# Patient Record
Sex: Female | Born: 1975 | Race: Black or African American | Hispanic: No | Marital: Single | Smoking: Never smoker
Health system: Southern US, Community
[De-identification: ages and names within clinical notes are randomized; demographics above are authoritative.]

## PROBLEM LIST (undated history)

## (undated) DIAGNOSIS — E559 Vitamin D deficiency, unspecified: Secondary | ICD-10-CM

## (undated) DIAGNOSIS — R87619 Unspecified abnormal cytological findings in specimens from cervix uteri: Secondary | ICD-10-CM

## (undated) HISTORY — DX: Unspecified abnormal cytological findings in specimens from cervix uteri: R87.619

## (undated) HISTORY — DX: Vitamin D deficiency, unspecified: E55.9

---

## 1999-09-23 ENCOUNTER — Other Ambulatory Visit: Admission: RE | Admit: 1999-09-23 | Discharge: 1999-09-23 | Payer: Self-pay | Admitting: Obstetrics and Gynecology

## 2000-05-09 ENCOUNTER — Other Ambulatory Visit: Admission: RE | Admit: 2000-05-09 | Discharge: 2000-05-09 | Payer: Self-pay | Admitting: Obstetrics and Gynecology

## 2001-04-19 ENCOUNTER — Other Ambulatory Visit: Admission: RE | Admit: 2001-04-19 | Discharge: 2001-04-19 | Payer: Self-pay | Admitting: *Deleted

## 2002-09-14 ENCOUNTER — Other Ambulatory Visit: Admission: RE | Admit: 2002-09-14 | Discharge: 2002-09-14 | Payer: Self-pay | Admitting: Obstetrics and Gynecology

## 2006-10-20 ENCOUNTER — Other Ambulatory Visit: Admission: RE | Admit: 2006-10-20 | Discharge: 2006-10-20 | Payer: Self-pay | Admitting: Obstetrics & Gynecology

## 2014-01-25 ENCOUNTER — Encounter: Payer: Self-pay | Admitting: Certified Nurse Midwife

## 2014-01-28 ENCOUNTER — Encounter: Payer: Self-pay | Admitting: Certified Nurse Midwife

## 2014-01-28 ENCOUNTER — Ambulatory Visit (INDEPENDENT_AMBULATORY_CARE_PROVIDER_SITE_OTHER): Payer: BC Managed Care – PPO | Admitting: Certified Nurse Midwife

## 2014-01-28 VITALS — BP 112/77 | HR 84 | Resp 16 | Ht 60.25 in | Wt 152.0 lb

## 2014-01-28 DIAGNOSIS — Z01419 Encounter for gynecological examination (general) (routine) without abnormal findings: Secondary | ICD-10-CM

## 2014-01-28 DIAGNOSIS — Z309 Encounter for contraceptive management, unspecified: Secondary | ICD-10-CM

## 2014-01-28 MED ORDER — LEVONORGEST-ETH ESTRAD 91-DAY 0.15-0.03 MG PO TABS: 1.0000 | ORAL_TABLET | Freq: Every day | ORAL | Status: DC

## 2014-01-28 NOTE — Progress Notes (Signed)
38 y.o. G0P0000 Single African American Fe here for annual exam. Periods normal, but heavier days 2 an 4, but no issues with. No STD screening desired. No partner change. No health issues today.   Patient's last menstrual period was 10/31/2013.          Sexually active: yes  The current method of family planning is OCP (estrogen/progesterone).    Exercising: no  exercise Smoker:  no  Health Maintenance: Pap:  01-26-13 neg hpv hr neg MMG:  none Colonoscopy:  none BMD:   none TDaP:  2/13 Labs: none Self breast exam: done occ   reports that she has never smoked. She does not have any smokeless tobacco history on file. She reports that she drinks about 1.0 ounces of alcohol per week. She reports that she does not use illicit drugs.  Past Medical History  Diagnosis Date  . Abnormal Pap smear of cervix     ASCUS 2000,2007    Past Surgical History  Procedure Laterality Date  . 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 levonorgestrel-ethinyl estradiol (SEASONALE) 0.15-0.03 MG tablet Take 1 tablet by mouth daily.      . Multiple Vitamins-Minerals (MULTIVITAMIN PO) Take by mouth as needed.        No current facility-administered medications for this visit.    Family History  Problem Relation Age of Onset  . Cancer Paternal Grandfather     ROS:  Pertinent items are noted in HPI.  Otherwise, a comprehensive ROS was negative.  Exam:   BP 112/77  Pulse 84  Resp 16  Ht 5' 0.25" (1.53 m)  Wt 152 lb (68.947 kg)  BMI 29.45 kg/m2  LMP 10/31/2013 Height: 5' 0.25" (153 cm)  Ht Readings from Last 3 Encounters:  01/28/14 5' 0.25" (1.53 m)    General appearance: alert, cooperative and appears stated age Head: Normocephalic, without obvious abnormality, atraumatic Neck: no adenopathy, supple, symmetrical, trachea midline and thyroid normal to inspection and palpation Lungs: clear to auscultation bilaterally Breasts: normal  appearance, no masses or tenderness, No nipple retraction or dimpling, No nipple discharge or bleeding, No axillary or supraclavicular adenopathy Heart: regular rate and rhythm Abdomen: soft, non-tender; no masses,  no organomegaly Extremities: extremities normal, atraumatic, no cyanosis or edema Skin: Skin color, texture, turgor normal. No rashes or lesions Lymph nodes: Cervical, supraclavicular, and axillary nodes normal. No abnormal inguinal nodes palpated Neurologic: Grossly normal   Pelvic: External genitalia:  no lesions              Urethra:  normal appearing urethra with no masses, tenderness or lesions              Bartholin's and Skene's: normal                 Vagina: normal appearing vagina with normal color and discharge, no lesions              Cervix: normal,  non tender              Pap taken: no Bimanual Exam:  Uterus:  normal size, contour, position, consistency, mobility, non-tender and mid position              Adnexa: normal adnexa and no mass, fullness, tenderness               Rectovaginal: Confirms  Anus:  normal sphincter tone, no lesions  A:  Well Woman with normal exam  Contraception OCP desired  P:   Reviewed health and wellness pertinent to exam  Rx Seasonale see order  Pap smear as per guidelines   Mammogram Start at age 38 given information to schedule pap smear not taken today  counseled on breast self exam, mammography screening, STD prevention, HIV risk factors and prevention, use and side effects of OCP's, adequate intake of calcium and vitamin D, diet and exercise  return annually or prn  An After Visit Summary was printed and given to the patient.

## 2014-01-28 NOTE — Patient Instructions (Signed)
General topics  Next pap or exam is  due in 1 year Take a Women's multivitamin Take 1200 mg. of calcium daily - prefer dietary If any concerns in interim to call back  Breast Self-Awareness Practicing breast self-awareness may pick up problems early, prevent significant medical complications, and possibly save your life. By practicing breast self-awareness, you can become familiar with how your breasts look and feel and if your breasts are changing. This allows you to notice changes early. It can also offer you some reassurance that your breast health is good. One way to learn what is normal for your breasts and whether your breasts are changing is to do a breast self-exam. If you find a lump or something that was not present in the past, it is best to contact your caregiver right away. Other findings that should be evaluated by your caregiver include nipple discharge, especially if it is bloody; skin changes or reddening; areas where the skin seems to be pulled in (retracted); or new lumps and bumps. Breast pain is seldom associated with cancer (malignancy), but should also be evaluated by a caregiver. BREAST SELF-EXAM The best time to examine your breasts is 5 7 days after your menstrual period is over.  ExitCare Patient Information 2013 [employer].   Exercise to Stay Healthy Exercise helps you become and stay healthy. EXERCISE IDEAS AND TIPS Choose exercises that:  You enjoy.  Fit into your day. You do not need to exercise really hard to be healthy. You can do exercises at a slow or medium level and stay healthy. You can:  Stretch before and after working out.  Try yoga, Pilates, or tai chi.  Lift weights.  Walk fast, swim, jog, run, climb stairs, bicycle, dance, or rollerskate.  Take aerobic classes. Exercises that burn about 150 calories:  Running 1  miles in 15 minutes.  Playing volleyball for 45 to 60 minutes.  Washing and waxing a car for 45 to 60  minutes.  Playing touch football for 45 minutes.  Walking 1  miles in 35 minutes.  Pushing a stroller 1  miles in 30 minutes.  Playing basketball for 30 minutes.  Raking leaves for 30 minutes.  Bicycling 5 miles in 30 minutes.  Walking 2 miles in 30 minutes.  Dancing for 30 minutes.  Shoveling snow for 15 minutes.  Swimming laps for 20 minutes.  Walking up stairs for 15 minutes.  Bicycling 4 miles in 15 minutes.  Gardening for 30 to 45 minutes.  Jumping rope for 15 minutes.  Washing windows or floors for 45 to 60 minutes. Document Released: 01/08/2011 Document Revised: 02/28/2012 Document Reviewed: 01/08/2011 ExitCare Patient Information 2013 [employer].   Other topics ( that may be useful information):    Sexually Transmitted Disease Sexually transmitted disease (STD) refers to any infection that is passed from person to person during sexual activity. This may happen by way of saliva, semen, blood, vaginal mucus, or urine. Common STDs include:  Gonorrhea.  Chlamydia.  Syphilis.  HIV/AIDS.  Genital herpes.  Hepatitis B and C.  Trichomonas.  Human papillomavirus (HPV).  Pubic lice. CAUSES  An STD may be spread by bacteria, virus, or parasite. A person can get an STD by:  Sexual intercourse with an infected person.  Sharing sex toys with an infected person.  Sharing needles with an infected person.  Having intimate contact with the genitals, mouth, or rectal areas of an infected person. SYMPTOMS  Some people may not have any symptoms, but   they can still pass the infection to others. Different STDs have different symptoms. Symptoms include:  Painful or bloody urination.  Pain in the pelvis, abdomen, vagina, anus, throat, or eyes.  Skin rash, itching, irritation, growths, or sores (lesions). These usually occur in the genital or anal area.  Abnormal vaginal discharge.  Penile discharge in men.  Soft, flesh-colored skin growths in the  genital or anal area.  Fever.  Pain or bleeding during sexual intercourse.  Swollen glands in the groin area.  Yellow skin and eyes (jaundice). This is seen with hepatitis. DIAGNOSIS  To make a diagnosis, your caregiver may:  Take a medical history.  Perform a physical exam.  Take a specimen (culture) to be examined.  Examine a sample of discharge under a microscope.  Perform blood test TREATMENT   Chlamydia, gonorrhea, trichomonas, and syphilis can be cured with antibiotic medicine.  Genital herpes, hepatitis, and HIV can be treated, but not cured, with prescribed medicines. The medicines will lessen the symptoms.  Genital warts from HPV can be treated with medicine or by freezing, burning (electrocautery), or surgery. Warts may come back.  HPV is a virus and cannot be cured with medicine or surgery.However, abnormal areas may be followed very closely by your caregiver and may be removed from the cervix, vagina, or vulva through office procedures or surgery. If your diagnosis is confirmed, your recent sexual partners need treatment. This is true even if they are symptom-free or have a negative culture or evaluation. They should not have sex until their caregiver says it is okay. HOME CARE INSTRUCTIONS  All sexual partners should be informed, tested, and treated for all STDs.  Take your antibiotics as directed. Finish them even if you start to feel better.  Only take over-the-counter or prescription medicines for pain, discomfort, or fever as directed by your caregiver.  Rest.  Eat a balanced diet and drink enough fluids to keep your urine clear or pale yellow.  Do not have sex until treatment is completed and you have followed up with your caregiver. STDs should be checked after treatment.  Keep all follow-up appointments, Pap tests, and blood tests as directed by your caregiver.  Only use latex condoms and water-soluble lubricants during sexual activity. Do not use  petroleum jelly or oils.  Avoid alcohol and illegal drugs.  Get vaccinated for HPV and hepatitis. If you have not received these vaccines in the past, talk to your caregiver about whether one or both might be right for you.  Avoid risky sex practices that can break the skin. The only way to avoid getting an STD is to avoid all sexual activity.Latex condoms and dental dams (for oral sex) will help lessen the risk of getting an STD, but will not completely eliminate the risk. SEEK MEDICAL CARE IF:   You have a fever.  You have any new or worsening symptoms. Document Released: 02/26/2003 Document Revised: 02/28/2012 Document Reviewed: 03/05/2011 [facility] Patient Information 2013 [employer].    Domestic Abuse You are being battered or abused if someone close to you hits, pushes, or physically hurts you in any way. You also are being abused if you are forced into activities. You are being sexually abused if you are forced to have sexual contact of any kind. You are being emotionally abused if you are made to feel worthless or if you are constantly threatened. It is important to remember that help is available. No one has the right to abuse you. PREVENTION OF FURTHER  ABUSE  Learn the warning signs of danger. This varies with situations but may include: the use of alcohol, threats, isolation from friends and family, or forced sexual contact. Leave if you feel that violence is going to occur.  If you are attacked or beaten, report it to the police so the abuse is documented. You do not have to press charges. The police can protect you while you or the attackers are leaving. Get the officer's name and badge number and a copy of the report.  Find someone you can trust and tell them what is happening to you: your caregiver, a nurse, clergy member, close friend or family member. Feeling ashamed is natural, but remember that you have done nothing wrong. No one deserves abuse. Document Released:  12/03/2000 Document Revised: 02/28/2012 Document Reviewed: 02/11/2011 ExitCare Patient Information 2013 [employer].    How Much is Too Much Alcohol? Drinking too much alcohol can cause injury, accidents, and health problems. These types of problems can include:   Car crashes.  Falls.  Family fighting (domestic violence).  Drowning.  Fights.  Injuries.  Burns.  Damage to certain organs.  Having a baby with birth defects. ONE DRINK CAN BE TOO MUCH WHEN YOU ARE:  Working.  Pregnant or breastfeeding.  Taking medicines. Ask your doctor.  Driving or planning to drive. If you or someone you know has a drinking problem, get help from a doctor.  Document Released: 10/02/2009 Document Revised: 02/28/2012 Document Reviewed: 10/02/2009 ExitCare Patient Information 2013 [employer].   Smoking Hazards Smoking cigarettes is extremely bad for your health. Tobacco smoke has over 200 known poisons in it. There are over 60 chemicals in tobacco smoke that cause cancer. Some of the chemicals found in cigarette smoke include:   Cyanide.  Benzene.  Formaldehyde.  Methanol (wood alcohol).  Acetylene (fuel used in welding torches).  Ammonia. Cigarette smoke also contains the poisonous gases nitrogen oxide and carbon monoxide.  Cigarette smokers have an increased risk of many serious medical problems and Smoking causes approximately:  90% of all lung cancer deaths in men.  80% of all lung cancer deaths in women.  90% of deaths from chronic obstructive lung disease. Compared with nonsmokers, smoking increases the risk of:  Coronary heart disease by 2 to 4 times.  Stroke by 2 to 4 times.  Men developing lung cancer by 23 times.  Women developing lung cancer by 13 times.  Dying from chronic obstructive lung diseases by 12 times.  . Smoking is the most preventable cause of death and disease in our society.  WHY IS SMOKING ADDICTIVE?  Nicotine is the chemical  agent in tobacco that is capable of causing addiction or dependence.  When you smoke and inhale, nicotine is absorbed rapidly into the bloodstream through your lungs. Nicotine absorbed through the lungs is capable of creating a powerful addiction. Both inhaled and non-inhaled nicotine may be addictive.  Addiction studies of cigarettes and spit tobacco show that addiction to nicotine occurs mainly during the teen years, when young people begin using tobacco products. WHAT ARE THE BENEFITS OF QUITTING?  There are many health benefits to quitting smoking.   Likelihood of developing cancer and heart disease decreases. Health improvements are seen almost immediately.  Blood pressure, pulse rate, and breathing patterns start returning to normal soon after quitting. QUITTING SMOKING   American Lung Association - 1-800-LUNGUSA  American Cancer Society - 1-800-ACS-2345 Document Released: 01/13/2005 Document Revised: 02/28/2012 Document Reviewed: 09/17/2009 ExitCare Patient Information 2013 ExitCare,   LLC.   Stress Management Stress is a state of physical or mental tension that often results from changes in your life or normal routine. Some common causes of stress are:  Death of a loved one.  Injuries or severe illnesses.  Getting fired or changing jobs.  Moving into a new home. Other causes may be:  Sexual problems.  Business or financial losses.  Taking on a large debt.  Regular conflict with someone at home or at work.  Constant tiredness from lack of sleep. It is not just bad things that are stressful. It may be stressful to:  Win the lottery.  Get married.  Buy a new car. The amount of stress that can be easily tolerated varies from person to person. Changes generally cause stress, regardless of the types of change. Too much stress can affect your health. It may lead to physical or emotional problems. Too little stress (boredom) may also become stressful. SUGGESTIONS TO  REDUCE STRESS:  Talk things over with your family and friends. It often is helpful to share your concerns and worries. If you feel your problem is serious, you may want to get help from a professional counselor.  Consider your problems one at a time instead of lumping them all together. Trying to take care of everything at once may seem impossible. List all the things you need to do and then start with the most important one. Set a goal to accomplish 2 or 3 things each day. If you expect to do too many in a single day you will naturally fail, causing you to feel even more stressed.  Do not use alcohol or drugs to relieve stress. Although you may feel better for a short time, they do not remove the problems that caused the stress. They can also be habit forming.  Exercise regularly - at least 3 times per week. Physical exercise can help to relieve that "uptight" feeling and will relax you.  The shortest distance between despair and hope is often a good night's sleep.  Go to bed and get up on time allowing yourself time for appointments without being rushed.  Take a short "time-out" period from any stressful situation that occurs during the day. Close your eyes and take some deep breaths. Starting with the muscles in your face, tense them, hold it for a few seconds, then relax. Repeat this with the muscles in your neck, shoulders, hand, stomach, back and legs.  Take good care of yourself. Eat a balanced diet and get plenty of rest.  Schedule time for having fun. Take a break from your daily routine to relax. HOME CARE INSTRUCTIONS   Call if you feel overwhelmed by your problems and feel you can no longer manage them on your own.  Return immediately if you feel like hurting yourself or someone else. Document Released: 06/01/2001 Document Revised: 02/28/2012 Document Reviewed: 01/22/2008 ExitCare Patient Information 2013 [employer]Care[employer].   

## 2014-02-01 NOTE — Progress Notes (Signed)
Reviewed personally.  M. Suzanne Jettie Mannor, MD.  

## 2015-01-22 ENCOUNTER — Other Ambulatory Visit: Payer: Self-pay | Admitting: Certified Nurse Midwife

## 2015-01-22 DIAGNOSIS — Z3041 Encounter for surveillance of contraceptive pills: Secondary | ICD-10-CM

## 2015-01-22 MED ORDER — LEVONORGEST-ETH ESTRAD 91-DAY 0.15-0.03 MG PO TABS: 1.0000 | ORAL_TABLET | Freq: Every day | ORAL | Status: DC

## 2015-01-22 NOTE — Telephone Encounter (Signed)
Medication refill request: Seasonale  Last AEX:  01/28/14 Next AEX: 03/09/15 Last MMG (if hormonal medication request): None Refill authorized: 01/28/14 #1pack/12R. Today #1pack/1R?

## 2015-01-22 NOTE — Telephone Encounter (Signed)
[facility], [facility]. [address] [phone] Patient request refills of birth control until her next appointment 03/19/15.

## 2015-01-23 ENCOUNTER — Other Ambulatory Visit: Payer: Self-pay | Admitting: Certified Nurse Midwife

## 2015-01-24 NOTE — Telephone Encounter (Signed)
Medication refill request: Seasonale Last AEX:  01/28/14 Next AEX: 03/19/15 Last MMG (if hormonal medication request): None [facility]22[facility] [address]

## 2015-01-30 ENCOUNTER — Ambulatory Visit: Payer: BC Managed Care – PPO | Admitting: Certified Nurse Midwife

## 2015-03-12 ENCOUNTER — Telehealth: Payer: Self-pay | Admitting: Certified Nurse Midwife

## 2015-03-12 NOTE — Telephone Encounter (Signed)
LMTCB about canceled appointment with DL °

## 2015-03-19 ENCOUNTER — Ambulatory Visit: Payer: Self-pay | Admitting: Certified Nurse Midwife

## 2015-03-20 ENCOUNTER — Encounter: Payer: Self-pay | Admitting: Certified Nurse Midwife

## 2015-03-20 ENCOUNTER — Ambulatory Visit (INDEPENDENT_AMBULATORY_CARE_PROVIDER_SITE_OTHER): Payer: BLUE CROSS/BLUE SHIELD | Admitting: Certified Nurse Midwife

## 2015-03-20 VITALS — BP 120/80 | HR 70 | Ht 60.0 in | Wt 148.0 lb

## 2015-03-20 DIAGNOSIS — Z3041 Encounter for surveillance of contraceptive pills: Secondary | ICD-10-CM

## 2015-03-20 DIAGNOSIS — Z0000 Encounter for general adult medical examination without abnormal findings: Secondary | ICD-10-CM | POA: Diagnosis not present

## 2015-03-20 DIAGNOSIS — Z124 Encounter for screening for malignant neoplasm of cervix: Secondary | ICD-10-CM | POA: Diagnosis not present

## 2015-03-20 DIAGNOSIS — Z01419 Encounter for gynecological examination (general) (routine) without abnormal findings: Secondary | ICD-10-CM | POA: Diagnosis not present

## 2015-03-20 MED ORDER — LEVONORGEST-ETH ESTRAD 91-DAY 0.15-0.03 MG PO TABS: 1.0000 | ORAL_TABLET | Freq: Every day | ORAL | Status: DC

## 2015-03-20 NOTE — Progress Notes (Signed)
Reviewed personally.  M. Suzanne Darcell Sabino, MD.  

## 2015-03-20 NOTE — Patient Instructions (Signed)

## 2015-03-20 NOTE — Progress Notes (Signed)
39 y.o. G0P0000 Single  African American Fe here for annual exam. Periods normal, no issues. Contraception working well, no missed pills. Sees [facility] if concerns or problems. Plans mammogram soon. No health issues today.   Patient's last menstrual period was 02/05/2015.          Sexually active: Yes.    The current method of family planning is OCP (estrogen/progesterone).    Exercising: No.  The patient does not participate in regular exercise at present. Smoker:  no  Health Maintenance: Pap:  01/26/2013 NEG HR HPV MMG:  N/A Colonoscopy:  N/A BMD:   N/A TDaP: 01/24/12  Labs: Hgb: 12.5 ; Urine: unable to void   reports that she has never smoked. She does not have any smokeless tobacco history on file. She reports that she drinks about 1.2 - 1.8 oz of alcohol per week. She reports that she does not use illicit drugs.  Past Medical History  Diagnosis Date  . Abnormal Pap smear of cervix     ASCUS 2000,2007    Past Surgical History  Procedure Laterality Date  . 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 levonorgestrel-ethinyl estradiol (SEASONALE) 0.15-0.03 MG tablet Take 1 tablet by mouth daily. 1 Package 0  . Multiple Vitamins-Minerals (MULTIVITAMIN PO) Take by mouth as needed.      No current facility-administered medications for this visit.    Family History  Problem Relation Age of Onset  . Cancer Paternal Grandfather     ROS:  Pertinent items are noted in HPI.  Otherwise, a comprehensive ROS was negative.  Exam:   BP 120/80 mmHg  Pulse 70  Ht 5' 0.25" (1.53 m)  Wt 148 lb (67.132 kg)  BMI 28.68 kg/m2  LMP 02/05/2015 Height: 5' 0.25" (153 cm) Ht Readings from Last 3 Encounters:  03/20/15 5' 0.25" (1.53 m)  01/28/14 5' 0.25" (1.53 m)    General appearance: alert, cooperative and appears stated age Head: Normocephalic, without obvious abnormality, atraumatic Neck: no adenopathy, supple, symmetrical, trachea  midline and thyroid normal to inspection and palpation Lungs: clear to auscultation bilaterally Breasts: normal appearance, no masses or tenderness, No nipple retraction or dimpling, No nipple discharge or bleeding, No axillary or supraclavicular adenopathy Heart: regular rate and rhythm Abdomen: soft, non-tender; no masses,  no organomegaly Extremities: extremities normal, atraumatic, no cyanosis or edema Skin: Skin color, texture, turgor normal. No rashes or lesions Lymph nodes: Cervical, supraclavicular, and axillary nodes normal. No abnormal inguinal nodes palpated Neurologic: Grossly normal   Pelvic: External genitalia:  no lesions              Urethra:  normal appearing urethra with no masses, tenderness or lesions              Bartholin's and Skene's: normal                 Vagina: normal appearing vagina with normal color and discharge, no lesions              Cervix: normal, non tender, no lesions              Pap taken: Yes.   Bimanual Exam:  Uterus:  normal size, contour, position, consistency, mobility, non-tender and retroverted              Adnexa: normal adnexa and no mass, fullness, tenderness  Rectovaginal: Confirms               Anus:  normal sphincter tone, no lesions  Chaperone present: Yes  A:  Well Woman with normal exam  Contraception OCP desired  P:   Reviewed health and wellness pertinent to exam  Pap smear taken today  counseled on breast self exam, mammography screening, use and side effects of OCP's, adequate intake of calcium and vitamin D, diet and exercise return annually or prn  An After Visit Summary was printed and given to the patient.

## 2015-03-21 LAB — HEMOGLOBIN, FINGERSTICK: Hemoglobin, fingerstick: 12.5 g/dL (ref 12.0–16.0)

## 2015-03-24 ENCOUNTER — Telehealth: Payer: Self-pay

## 2015-03-24 LAB — IPS PAP TEST WITH REFLEX TO HPV

## 2015-03-24 NOTE — Telephone Encounter (Signed)
Patient notified of results as written by provider 

## 2015-03-24 NOTE — Telephone Encounter (Signed)
-----   Message from Verner Choleborah S Leonard, CNM sent at 03/24/2015  7:53 AM EDT ----- Notify patient that pap smear ASCUS but negative HPVHR no further evaluation needed at this time. Needs repeat pap smear in one year. 02

## 2015-03-24 NOTE — Telephone Encounter (Signed)
lmtcb

## 2015-03-24 NOTE — Telephone Encounter (Signed)
Pt returning call

## 2015-05-22 ENCOUNTER — Ambulatory Visit: Payer: Self-pay | Admitting: Certified Nurse Midwife

## 2016-03-24 ENCOUNTER — Encounter: Payer: Self-pay | Admitting: Certified Nurse Midwife

## 2016-03-24 ENCOUNTER — Ambulatory Visit (INDEPENDENT_AMBULATORY_CARE_PROVIDER_SITE_OTHER): Payer: BLUE CROSS/BLUE SHIELD | Admitting: Certified Nurse Midwife

## 2016-03-24 VITALS — BP 120/70 | HR 70 | Resp 18 | Ht 60.25 in | Wt 152.0 lb

## 2016-03-24 DIAGNOSIS — Z01419 Encounter for gynecological examination (general) (routine) without abnormal findings: Secondary | ICD-10-CM | POA: Diagnosis not present

## 2016-03-24 DIAGNOSIS — Z0000 Encounter for general adult medical examination without abnormal findings: Secondary | ICD-10-CM

## 2016-03-24 DIAGNOSIS — Z124 Encounter for screening for malignant neoplasm of cervix: Secondary | ICD-10-CM | POA: Diagnosis not present

## 2016-03-24 DIAGNOSIS — Z3041 Encounter for surveillance of contraceptive pills: Secondary | ICD-10-CM

## 2016-03-24 LAB — HEPATITIS C ANTIBODY: HCV AB: NEGATIVE

## 2016-03-24 LAB — LIPID PANEL
Cholesterol: 169 mg/dL (ref 125–200)
HDL: 56 mg/dL (ref >=46)
LDL CALC: 101 mg/dL (ref <130)
Total CHOL/HDL Ratio: 3 Ratio (ref <=5.0)
Triglycerides: 60 mg/dL (ref <150)
VLDL: 12 mg/dL (ref <30)

## 2016-03-24 MED ORDER — LEVONORGEST-ETH ESTRAD 91-DAY 0.15-0.03 MG PO TABS: 1.0000 | ORAL_TABLET | Freq: Every day | ORAL | Status: DC

## 2016-03-24 NOTE — Progress Notes (Signed)
Encounter reviewed Lyn Joens, MD   

## 2016-03-24 NOTE — Progress Notes (Signed)
40 y.o. G0P0000 Single  African American Fe here for annual exam. Periods normal,no issues. Contraception working well. Sees [facility], prn.  Mother diagnosed with uterine cancer, had TAH with radiation.  Patient requests screening labs today. Trying to get "motivated" to exercise for weight control. No partner change, no STD screening desired. No health issues today.  Patient's last menstrual period was 02/01/2016.          Sexually active: Yes.    The current method of family planning is OCP (estrogen/progesterone).    Exercising: No.  exercise Smoker:  no  Health Maintenance: Pap:  03-20-15 [facility] HPV HR neg MMG:  none Colonoscopy:  none BMD:   none TDaP:  2013 Shingles: no Pneumonia: no Hep C and HIV: not done Labs: hgb-13.1 Self breast exam: done occ   reports that she has never smoked. She does not have any smokeless tobacco history on file. She reports that she drinks about 1.2 - 1.8 oz of alcohol per week. She reports that she does not use illicit drugs.  Past Medical History  Diagnosis Date  . Abnormal Pap smear of cervix     ASCUS 2000,2007    Past Surgical History  Procedure Laterality Date  . 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 Acetaminophen (TYLENOL PO) Take by mouth as needed.    Marland Kitchen levonorgestrel-ethinyl estradiol (SEASONALE) 0.15-0.03 MG tablet Take 1 tablet by mouth daily. 1 Package 4  . Multiple Vitamins-Minerals (MULTIVITAMIN PO) Take by mouth as needed.      No current facility-administered medications for this visit.    Family History  Problem Relation Age of Onset  . Cancer Paternal Grandfather   . Uterine cancer Mother     ROS:  Pertinent items are noted in HPI.  Otherwise, a comprehensive ROS was negative.  Exam:   Ht 5' 0.25" (1.53 m)  Wt 152 lb (68.947 kg)  BMI 29.45 kg/m2  LMP 02/01/2016 Height: 5' 0.25" (153 cm) Ht Readings from Last 3 Encounters:  03/24/16 5'  0.25" (1.53 m)  03/20/15 5' (1.524 m)  01/28/14 5' 0.25" (1.53 m)    General appearance: alert, cooperative and appears stated age Head: Normocephalic, without obvious abnormality, atraumatic Neck: no adenopathy, supple, symmetrical, trachea midline and thyroid normal to inspection and palpation Lungs: clear to auscultation bilaterally Breasts: normal appearance, no masses or tenderness, No nipple retraction or dimpling, No nipple discharge or bleeding, No axillary or supraclavicular adenopathy Heart: regular rate and rhythm Abdomen: soft, non-tender; no masses,  no organomegaly Extremities: extremities normal, atraumatic, no cyanosis or edema Skin: Skin color, texture, turgor normal. No rashes or lesions Lymph nodes: Cervical, supraclavicular, and axillary nodes normal. No abnormal inguinal nodes palpated Neurologic: Grossly normal   Pelvic: External genitalia:  no lesions              Urethra:  normal appearing urethra with no masses, tenderness or lesions              Bartholin's and Skene's: normal                 Vagina: normal appearing vagina with normal color and discharge, no lesions              Cervix: no cervical motion tenderness, no lesions and nulliparous appearance              Pap taken: Yes.   Bimanual Exam:  Uterus:  normal size, contour, position, consistency, mobility, non-tender and retroverted              Adnexa: normal adnexa and no mass, fullness, tenderness               Rectovaginal: Confirms               Anus:  normal sphincter tone, no lesions  Chaperone present: yes  A:  Well Woman with normal exam  Contraception OCP desired  Mammogram due  Screening labs  Follow up pap from ASCUS - HPVHR, previous CIN1  Recent family history of Uterine cancer( mother [id number])  P:   Reviewed health and wellness pertinent to exam  Rx Seasonale see order  Discussed risks and benefits of mammogram, plans to schedule this year  Labs: HIV,Hep C, Lipid labs, TSH, Vitamin  D  Pap smear as above with HPV reflex   counseled on breast self exam, mammography screening, use and side effects of OCP's, adequate intake of calcium and vitamin D  return annually or prn  An After Visit Summary was printed and given to the patient.

## 2016-03-24 NOTE — Patient Instructions (Signed)
EXERCISE AND DIET:  We recommended that you start or continue a regular exercise program for good health. Regular exercise means any activity that makes your heart beat faster and makes you sweat.  We recommend exercising at least 30 minutes per day at least 3 days a week, preferably 4 or 5.  We also recommend a diet low in fat and sugar.  Inactivity, poor dietary choices and obesity can cause diabetes, heart attack, stroke, and kidney damage, among others.    ALCOHOL AND SMOKING:  Women should limit their alcohol intake to no more than 7 drinks/beers/glasses of wine (combined, not each!) per week. Moderation of alcohol intake to this level decreases your risk of breast cancer and liver damage. And of course, no recreational drugs are part of a healthy lifestyle.  And absolutely no smoking or even second hand smoke. Most people know smoking can cause heart and lung diseases, but did you know it also contributes to weakening of your bones? Aging of your skin?  Yellowing of your teeth and nails?  CALCIUM AND VITAMIN D:  Adequate intake of calcium and Vitamin D are recommended.  The recommendations for exact amounts of these supplements seem to change often, but generally speaking 600 mg of calcium (either carbonate or citrate) and 800 units of Vitamin D per day seems prudent. Certain women may benefit from higher intake of Vitamin D.  If you are among these women, your doctor will have told you during your visit.    PAP SMEARS:  Pap smears, to check for cervical cancer or precancers,  have traditionally been done yearly, although recent scientific advances have shown that most women can have pap smears less often.  However, every woman still should have a physical exam from her gynecologist every year. It will include a breast check, inspection of the vulva and vagina to check for abnormal growths or skin changes, a visual exam of the cervix, and then an exam to evaluate the size and shape of the uterus and  ovaries.  And after 42 years of age, a rectal exam is indicated to check for rectal cancers. We will also provide age appropriate advice regarding health maintenance, like when you should have certain vaccines, screening for sexually transmitted diseases, bone density testing, colonoscopy, mammograms, etc.   MAMMOGRAMS:  All women over 40 years old should have a yearly mammogram. Many facilities now offer a "3D" mammogram, which may cost around $50 extra out of pocket. If possible,  we recommend you accept the option to have the 3D mammogram performed.  It both reduces the number of women who will be called back for extra views which then turn out to be normal, and it is better than the routine mammogram at detecting truly abnormal areas.    COLONOSCOPY:  Colonoscopy to screen for colon cancer is recommended for all women at age 50.  We know, you hate the idea of the prep.  We agree, BUT, having colon cancer and not knowing it is worse!!  Colon cancer so often starts as a polyp that can be seen and removed at colonscopy, which can quite literally save your life!  And if your first colonoscopy is normal and you have no family history of colon cancer, most women don't have to have it again for 10 years.  Once every ten years, you can do something that may end up saving your life, right?  We will be happy to help you get it scheduled when you are ready.    Be sure to check your insurance coverage so you understand how much it will cost.  It may be covered as a preventative service at no cost, but you should check your particular policy.     Oral Contraception Information Oral contraceptive pills (OCPs) are medicines taken to prevent pregnancy. OCPs work by preventing the ovaries from releasing eggs. The hormones in OCPs also cause the cervical mucus to thicken, preventing the sperm from entering the uterus. The hormones also cause the uterine lining to become thin, not allowing a fertilized egg to attach to the  inside of the uterus. OCPs are highly effective when taken exactly as prescribed. However, OCPs do not prevent sexually transmitted diseases (STDs). Safe sex practices, such as using condoms along with the pill, can help prevent STDs.  Before taking the pill, you may have a physical exam and Pap test. Your health care provider may order blood tests. The health care provider will make sure you are a good candidate for oral contraception. Discuss with your health care provider the possible side effects of the OCP you may be prescribed. When starting an OCP, it can take 2 to 3 months for the body to adjust to the changes in hormone levels in your body.  TYPES OF ORAL CONTRACEPTION  The combination pill--This pill contains estrogen and progestin (synthetic progesterone) hormones. The combination pill comes in 21-day, 28-day, or 91-day packs. Some types of combination pills are meant to be taken continuously (365-day pills). With 21-day packs, you do not take pills for 7 days after the last pill. With 28-day packs, the pill is taken every day. The last 7 pills are without hormones. Certain types of pills have more than 21 hormone-containing pills. With 91-day packs, the first 84 pills contain both hormones, and the last 7 pills contain no hormones or contain estrogen only.  The minipill--This pill contains the progesterone hormone only. The pill is taken every day continuously. It is very important to take the pill at the same time each day. The minipill comes in packs of 28 pills. All 28 pills contain the hormone.  ADVANTAGES OF ORAL CONTRACEPTIVE PILLS  Decreases premenstrual symptoms.   Treats menstrual period cramps.   Regulates the menstrual cycle.   Decreases a heavy menstrual flow.   May treatacne, depending on the type of pill.   Treats abnormal uterine bleeding.   Treats polycystic ovarian syndrome.   Treats endometriosis.   Can be used as emergency contraception.  THINGS  THAT CAN MAKE ORAL CONTRACEPTIVE PILLS LESS EFFECTIVE OCPs can be less effective if:   You forget to take the pill at the same time every day.   You have a stomach or intestinal disease that lessens the absorption of the pill.   You take OCPs with other medicines that make OCPs less effective, such as antibiotics, certain HIV medicines, and some seizure medicines.   You take expired OCPs.   You forget to restart the pill on day 7, when using the packs of 21 pills.  RISKS ASSOCIATED WITH ORAL CONTRACEPTIVE PILLS  Oral contraceptive pills can sometimes cause side effects, such as:  Headache.  Nausea.  Breast tenderness.  Irregular bleeding or spotting. Combination pills are also associated with a small increased risk of:  Blood clots.  Heart attack.  Stroke.   This information is not intended to replace advice given to you by your health care provider. Make sure you discuss any questions you have with your health care provider.     Document Released: 02/26/2003 Document Revised: 09/26/2013 Document Reviewed: 05/27/2013 Elsevier Interactive Patient Education 2016 Elsevier Inc.  

## 2016-03-25 ENCOUNTER — Other Ambulatory Visit: Payer: Self-pay

## 2016-03-25 DIAGNOSIS — E559 Vitamin D deficiency, unspecified: Secondary | ICD-10-CM

## 2016-03-25 LAB — HIV ANTIBODY (ROUTINE TESTING W REFLEX): HIV 1&2 Ab, 4th Generation: NONREACTIVE

## 2016-03-25 LAB — VITAMIN D 25 HYDROXY (VIT D DEFICIENCY, FRACTURES): Vit D, 25-Hydroxy: 9 ng/mL — ABNORMAL LOW (ref 30–100)

## 2016-03-25 LAB — TSH: TSH: 1.57 mIU/L

## 2016-03-25 MED ORDER — VITAMIN D (ERGOCALCIFEROL) 1.25 MG (50000 UNIT) PO CAPS: 50000.0000 [IU] | ORAL_CAPSULE | ORAL | Status: DC

## 2016-03-25 NOTE — Telephone Encounter (Signed)
Vit d 50,000iu sent to pharmacy per lab result once weekly for .

## 2016-03-26 LAB — HEMOGLOBIN, FINGERSTICK: Hemoglobin, fingerstick: 13.1 g/dL (ref 12.0–16.0)

## 2016-03-30 LAB — IPS PAP TEST WITH REFLEX TO HPV

## 2016-06-29 ENCOUNTER — Other Ambulatory Visit (INDEPENDENT_AMBULATORY_CARE_PROVIDER_SITE_OTHER): Payer: BLUE CROSS/BLUE SHIELD

## 2016-06-29 DIAGNOSIS — E559 Vitamin D deficiency, unspecified: Secondary | ICD-10-CM

## 2016-06-30 LAB — VITAMIN D 25 HYDROXY (VIT D DEFICIENCY, FRACTURES): Vit D, 25-Hydroxy: 66 ng/mL (ref 30–100)

## 2017-03-25 ENCOUNTER — Ambulatory Visit (INDEPENDENT_AMBULATORY_CARE_PROVIDER_SITE_OTHER): Payer: BLUE CROSS/BLUE SHIELD | Admitting: Certified Nurse Midwife

## 2017-03-25 ENCOUNTER — Encounter: Payer: Self-pay | Admitting: Certified Nurse Midwife

## 2017-03-25 VITALS — BP 110/78 | HR 68 | Resp 16 | Ht 60.25 in | Wt 150.0 lb

## 2017-03-25 DIAGNOSIS — Z124 Encounter for screening for malignant neoplasm of cervix: Secondary | ICD-10-CM | POA: Diagnosis not present

## 2017-03-25 DIAGNOSIS — E559 Vitamin D deficiency, unspecified: Secondary | ICD-10-CM

## 2017-03-25 DIAGNOSIS — Z3041 Encounter for surveillance of contraceptive pills: Secondary | ICD-10-CM | POA: Diagnosis not present

## 2017-03-25 DIAGNOSIS — Z01419 Encounter for gynecological examination (general) (routine) without abnormal findings: Secondary | ICD-10-CM

## 2017-03-25 MED ORDER — LEVONORGEST-ETH ESTRAD 91-DAY 0.15-0.03 MG PO TABS: 1.0000 | ORAL_TABLET | Freq: Every day | ORAL | 4 refills | Status: DC

## 2017-03-25 NOTE — Patient Instructions (Signed)
EXERCISE AND DIET:  We recommended that you start or continue a regular exercise program for good health. Regular exercise means any activity that makes your heart beat faster and makes you sweat.  We recommend exercising at least 30 minutes per day at least 3 days a week, preferably 4 or 5.  We also recommend a diet low in fat and sugar.  Inactivity, poor dietary choices and obesity can cause diabetes, heart attack, stroke, and kidney damage, among others.    ALCOHOL AND SMOKING:  Women should limit their alcohol intake to no more than 7 drinks/beers/glasses of wine (combined, not each!) per week. Moderation of alcohol intake to this level decreases your risk of breast cancer and liver damage. And of course, no recreational drugs are part of a healthy lifestyle.  And absolutely no smoking or even second hand smoke. Most people know smoking can cause heart and lung diseases, but did you know it also contributes to weakening of your bones? Aging of your skin?  Yellowing of your teeth and nails?  CALCIUM AND VITAMIN D:  Adequate intake of calcium and Vitamin D are recommended.  The recommendations for exact amounts of these supplements seem to change often, but generally speaking 600 mg of calcium (either carbonate or citrate) and 800 units of Vitamin D per day seems prudent. Certain women may benefit from higher intake of Vitamin D.  If you are among these women, your doctor will have told you during your visit.    PAP SMEARS:  Pap smears, to check for cervical cancer or precancers,  have traditionally been done yearly, although recent scientific advances have shown that most women can have pap smears less often.  However, every woman still should have a physical exam from her gynecologist every year. It will include a breast check, inspection of the vulva and vagina to check for abnormal growths or skin changes, a visual exam of the cervix, and then an exam to evaluate the size and shape of the uterus and  ovaries.  And after 43 years of age, a rectal exam is indicated to check for rectal cancers. We will also provide age appropriate advice regarding health maintenance, like when you should have certain vaccines, screening for sexually transmitted diseases, bone density testing, colonoscopy, mammograms, etc.   MAMMOGRAMS:  All women over 40 years old should have a yearly mammogram. Many facilities now offer a "3D" mammogram, which may cost around $50 extra out of pocket. If possible,  we recommend you accept the option to have the 3D mammogram performed.  It both reduces the number of women who will be called back for extra views which then turn out to be normal, and it is better than the routine mammogram at detecting truly abnormal areas.    COLONOSCOPY:  Colonoscopy to screen for colon cancer is recommended for all women at age 50.  We know, you hate the idea of the prep.  We agree, BUT, having colon cancer and not knowing it is worse!!  Colon cancer so often starts as a polyp that can be seen and removed at colonscopy, which can quite literally save your life!  And if your first colonoscopy is normal and you have no family history of colon cancer, most women don't have to have it again for 10 years.  Once every ten years, you can do something that may end up saving your life, right?  We will be happy to help you get it scheduled when you are ready.    Be sure to check your insurance coverage so you understand how much it will cost.  It may be covered as a preventative service at no cost, but you should check your particular policy.      Vitamin D Deficiency Vitamin D deficiency is when your body does not have enough vitamin D. Vitamin D is important because:  It helps your body use other minerals that your body needs.  It helps keep your bones strong and healthy.  It may help to prevent some diseases.  It helps your heart and other muscles work well. You can get vitamin D by:  Eating foods with  vitamin D in them.  Drinking or eating milk or other foods that have had vitamin D added to them.  Taking a vitamin D supplement.  Being in the sun. Not getting enough vitamin D can make your bones become soft. It can also cause other health problems. Follow these instructions at home:  Take medicines and supplements only as told by your doctor.  Eat foods that have vitamin D. These include:  Dairy products, cereals, or juices with added vitamin D. Check the label for vitamin D.  Fatty fish like salmon or trout.  Eggs.  Oysters.  Do not use tanning beds.  Stay at a healthy weight. Lose weight, if needed.  Keep all follow-up visits as told by your doctor. This is important. Contact a doctor if:  Your symptoms do not go away.  You feel sick to your stomach (nauseous).  Youthrow up (vomit).  You poop less often than usual or you have trouble pooping (constipation). This information is not intended to replace advice given to you by your health care provider. Make sure you discuss any questions you have with your health care provider. Document Released: 11/25/2011 Document Revised: 05/13/2016 Document Reviewed: 04/23/2015 Elsevier Interactive Patient Education  2017 Elsevier Inc.  

## 2017-03-25 NOTE — Progress Notes (Signed)
41 y.o. [id number] Single  African American Fe here for annual exam.  Periods every 3  Months with Seasonale. Happy with choice. No partner change. No STD concerns. Has not had mammogram yet, but plans to his spring. Sees Urgent care if needed. Screening labs if needed.  Patient's last menstrual period was 02/02/2017 (exact date).          Sexually active: Yes.    The current method of family planning is OCP (estrogen/progesterone).    Exercising: No.  exercise Smoker:  no  Health Maintenance: Pap:  03-20-15 ASCUS HPV HR neg, 4/17 ASCUS HPV  HR neg (hx of colpo 2000 CIN1) MMG:  none Colonoscopy:  none BMD:   none TDaP:  2013 Shingles: no Pneumonia: no Hep C and HIV: both neg 2017 Labs: none Self breast exam: done occ   reports that she has never smoked. She has never used smokeless tobacco. She reports that she drinks about 1.2 - 1.8 oz of alcohol per week . She reports that she does not use drugs.  Past Medical History:  Diagnosis Date  . Abnormal Pap smear of cervix    ASCUS 2000,2007    Past Surgical History:  Procedure Laterality Date  . COLPOSCOPY  10/00   CIN1    Current Outpatient Prescriptions  Medication Sig Dispense Refill  . Acetaminophen (TYLENOL PO) Take by mouth as needed.    Marland Kitchen levonorgestrel-ethinyl estradiol (SEASONALE,INTROVALE,JOLESSA) 0.15-0.03 MG tablet Take 1 tablet by mouth daily. 1 Package 4  . Multiple Vitamins-Minerals (MULTIVITAMIN PO) Take by mouth as needed.      No current facility-administered medications for this visit.     Family History  Problem Relation Age of Onset  . Cancer Paternal Grandfather   . Uterine cancer Mother 36    had radiation    ROS:  Pertinent items are noted in HPI.  Otherwise, a comprehensive ROS was negative.  Exam:   BP 110/78   Pulse 68   Resp 16   Ht 5' 0.25" (1.53 m)   Wt 150 lb (68 kg)   LMP 02/02/2017 (Exact Date)   BMI 29.05 kg/m  Height: 5' 0.25" (153 cm) Ht Readings from Last 3 Encounters:   03/25/17 5' 0.25" (1.53 m)  03/24/16 5' 0.25" (1.53 m)  03/20/15 5' (1.524 m)    General appearance: alert, cooperative and appears stated age Head: Normocephalic, without obvious abnormality, atraumatic Neck: no adenopathy, supple, symmetrical, trachea midline and thyroid normal to inspection and palpation Lungs: clear to auscultation bilaterally Breasts: normal appearance, no masses or tenderness, No nipple retraction or dimpling, No nipple discharge or bleeding, No axillary or supraclavicular adenopathy Heart: regular rate and rhythm Abdomen: soft, non-tender; no masses,  no organomegaly Extremities: extremities normal, atraumatic, no cyanosis or edema Skin: Skin color, texture, turgor normal. No rashes or lesions Lymph nodes: Cervical, supraclavicular, and axillary nodes normal. No abnormal inguinal nodes palpated Neurologic: Grossly normal   Pelvic: External genitalia:  no lesions              Urethra:  normal appearing urethra with no masses, tenderness or lesions              Bartholin's and Skene's: normal                 Vagina: normal appearing vagina with normal color and discharge, no lesions              Cervix: no bleeding following Pap, no cervical motion tenderness and  no lesions              Pap taken: Yes.   Bimanual Exam:  Uterus:  normal size, contour, position, consistency, mobility, non-tender and anteverted              Adnexa: normal adnexa and no mass, fullness, tenderness               Rectovaginal: Confirms               Anus:  normal sphincter tone, no lesions  Chaperone present: yes  A:  Well Woman with normal exam  Contraception OCP desired  Vitamin D deficiency history  Follow up pap from [facility]US with negative HPVHR  Mammogram due, patient to schedule, declines our scheduling  P:   Reviewed health and wellness pertinent to exam  Rx Seasonale see order with instructions  Lab: Vitamin D  Pap smear as above   counseled on breast self exam,  mammography screening, STD prevention, HIV risk factors and prevention, use and side effects of OCP's, adequate intake of calcium and vitamin D, diet and exercise  return annually or prn  An After Visit Summary was printed and given to the patient.

## 2017-03-26 LAB — VITAMIN D 25 HYDROXY (VIT D DEFICIENCY, FRACTURES): Vit D, 25-Hydroxy: 27 ng/mL — ABNORMAL LOW (ref 30–100)

## 2017-03-29 ENCOUNTER — Other Ambulatory Visit: Payer: Self-pay

## 2017-03-29 DIAGNOSIS — E559 Vitamin D deficiency, unspecified: Secondary | ICD-10-CM

## 2017-03-29 MED ORDER — VITAMIN D (ERGOCALCIFEROL) 1.25 MG (50000 UNIT) PO CAPS: 50000.0000 [IU] | ORAL_CAPSULE | ORAL | 0 refills | Status: DC

## 2017-03-30 NOTE — Progress Notes (Signed)
Encounter reviewed Angelys Yetman, MD   

## 2017-03-31 LAB — IPS PAP TEST WITH HPV

## 2017-04-05 ENCOUNTER — Other Ambulatory Visit: Payer: Self-pay | Admitting: Certified Nurse Midwife

## 2017-04-05 ENCOUNTER — Telehealth: Payer: Self-pay | Admitting: *Deleted

## 2017-04-05 DIAGNOSIS — R87610 Atypical squamous cells of undetermined significance on cytologic smear of cervix (ASC-US): Secondary | ICD-10-CM

## 2017-04-05 NOTE — Telephone Encounter (Signed)
Spoke with patient, advised of results and recommendations as seen below per Leota Sauers, CNM. Current contraceptive, OCP, reports LMP first or second week of February unsure of exact date, has cycles q3 months. Patient scheduled for colpo on 04/15/17 at 2pm with Leota Sauers, CNM. Advised to take Motrin 800 mg with food and water one hour before procedure. Patient verbalizes understanding and is agreeable.  Routing to provider for final review. Patient is agreeable to disposition. Will close encounter.  Cc: Braxton Feathers

## 2017-04-05 NOTE — Telephone Encounter (Signed)
-----   Message from Verner Chol, CNM sent at 04/05/2017  7:50 AM EDT ----- Notify patient that pap smear is still ASCUS with negative HPVHR feel she needs colposcopy due continued finding. Order placed please schedule

## 2017-04-05 NOTE — Telephone Encounter (Signed)
Left message to call Jearline Hirschhorn at [phone].  

## 2017-04-11 ENCOUNTER — Telehealth: Payer: Self-pay | Admitting: Certified Nurse Midwife

## 2017-04-11 NOTE — Telephone Encounter (Signed)
Patient returning your call.

## 2017-04-11 NOTE — Telephone Encounter (Signed)
Called patient to review benefits for procedure. Left voicemail to call back and review. °

## 2017-04-12 NOTE — Telephone Encounter (Signed)
Patient returned call to Lauren Newman. Spoke with patient regarding benefit for colpocscopy. Patient understood and agreeable. Patient scheduled 04/15/17 with [name]. Patient aware of date, arrival time and cancellation policy. No further questions.  Routing to [employer] for final review

## 2017-04-15 ENCOUNTER — Ambulatory Visit (INDEPENDENT_AMBULATORY_CARE_PROVIDER_SITE_OTHER): Payer: BLUE CROSS/BLUE SHIELD | Admitting: Certified Nurse Midwife

## 2017-04-15 ENCOUNTER — Encounter: Payer: Self-pay | Admitting: Certified Nurse Midwife

## 2017-04-15 DIAGNOSIS — R87610 Atypical squamous cells of undetermined significance on cytologic smear of cervix (ASC-US): Secondary | ICD-10-CM

## 2017-04-15 HISTORY — PX: COLPOSCOPY: SHX161

## 2017-04-15 NOTE — Progress Notes (Addendum)
Patient ID: Lauren Newman, female   DOB: [birth year], 41 y.o.   MRN: [id number]  Chief Complaint  Patient presents with  . Colposcopy    //jj    HPI Zian Mohamed is a 41 y.o.G0P0 african [place]  female.  Here for colposcopy exam for ASCUS with negative HPVHR for the past two years. HPI  Indications: Pap smear on 4/6 2018 showed: ASCUS with NEGATIVE high risk HPV and same in 2017. Previous colposcopy: in 2000 Ascus.. Prior cervical treatment none  Past Medical History:  Diagnosis Date  . Abnormal Pap smear of cervix    ASCUS 2000,2007    Past Surgical History:  Procedure Laterality Date  . COLPOSCOPY  10/00   CIN1    Family History  Problem Relation Age of Onset  . Cancer Paternal Grandfather   . Uterine cancer Mother 68    had radiation    Social History Social History  Substance Use Topics  . Smoking status: Never Smoker  . Smokeless tobacco: Never Used  . Alcohol use 1.2 - 1.8 oz/week    2 - 3 Standard drinks or equivalent per week    No Known Allergies  Current Outpatient Prescriptions  Medication Sig Dispense Refill  . Acetaminophen (TYLENOL PO) Take by mouth as needed.    Marland Kitchen levonorgestrel-ethinyl estradiol (SEASONALE,INTROVALE,JOLESSA) 0.15-0.03 MG tablet Take 1 tablet by mouth daily. 1 Package 4  . Multiple Vitamins-Minerals (MULTIVITAMIN PO) Take by mouth as needed.     . Vitamin D, Ergocalciferol, (DRISDOL) 50000 units CAPS capsule Take 1 capsule (50,000 Units total) by mouth every 7 (seven) days. 12 capsule 0   No current facility-administered medications for this visit.     Review of Systems Review of Systems  Constitutional: Negative.   Gastrointestinal: Negative.   Genitourinary: Negative for pelvic pain, vaginal bleeding, vaginal discharge and vaginal pain.  Skin: Negative.   Psychiatric/Behavioral: Negative.     Blood pressure 110/80, pulse 64, resp. rate 16, height 5' 0.25" (1.53 m), weight 152 lb (68.9 kg), last menstrual period  02/02/2017.  Physical Exam Physical Exam  Constitutional: She is oriented to person, place, and time. She appears well-developed and well-nourished.  Genitourinary: Vagina normal. There is no rash, tenderness or lesion on the right labia. There is no rash, tenderness or lesion on the left labia. No tenderness or bleeding in the vagina. No vaginal discharge found.    Neurological: She is alert and oriented to person, place, and time.  Skin: Skin is warm and dry.  Psychiatric: She has a normal mood and affect. Her behavior is normal. Judgment and thought content normal.    Data Reviewed Reviewed pap smears from past two years and questions addressed regarding ASCUS.  Assessment    Procedure Details  The risks and benefits of the procedure and Written informed consent obtained.  Speculum placed in vagina and excellent visualization of cervix achieved, Affirm taken,  cervix swabbed x 3 with saline and with  acetic acid solution. Cervix viewed with 3.75,7.5 and # 15 and green filter. Acetowhite lesions (2) noted at 7 o'clock. Lugol's applied and non staining noted on both. Biopsy taken of both in single biopsy. Ecc obtained. Monsel's applied. No active bleeding on speculum removal. Patient tolerated procedure well. Instructions given.  Specimens: 2  Complications: none.     Plan    Specimens labelled and sent to Pathology. Patient will be called with results once in and reviewed. Lab Affirm results will also called to patient.. Pathology reviewed. 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 ECC did not survive processing. Patient will be called with results and need for  Pap in one year. Pap recall  placed. 08      Danice Dippolito 04/15/2017, 2:45 PM

## 2017-04-15 NOTE — Progress Notes (Signed)
Hx of CIN previous colpo 03-25-17 ASCUS HPV HR- Pt took  ibuprofen at 12:30

## 2017-04-15 NOTE — Patient Instructions (Signed)

## 2017-04-16 LAB — WET PREP BY MOLECULAR PROBE
Candida species: NOT DETECTED
Gardnerella vaginalis: NOT DETECTED
Trichomonas vaginosis: NOT DETECTED

## 2017-04-19 ENCOUNTER — Encounter: Payer: Self-pay | Admitting: Certified Nurse Midwife

## 2017-04-19 LAB — IPS OTHER TISSUE BIOPSY

## 2017-06-28 ENCOUNTER — Other Ambulatory Visit: Payer: BLUE CROSS/BLUE SHIELD

## 2018-03-29 ENCOUNTER — Encounter: Payer: Self-pay | Admitting: Certified Nurse Midwife

## 2018-03-29 ENCOUNTER — Other Ambulatory Visit (HOSPITAL_COMMUNITY)
Admission: RE | Admit: 2018-03-29 | Discharge: 2018-03-29 | Disposition: A | Discharge status: Home / Self Care | Payer: BLUE CROSS/BLUE SHIELD | Source: Ambulatory Visit | Attending: Certified Nurse Midwife | Admitting: Certified Nurse Midwife

## 2018-03-29 ENCOUNTER — Other Ambulatory Visit: Payer: Self-pay

## 2018-03-29 ENCOUNTER — Ambulatory Visit (INDEPENDENT_AMBULATORY_CARE_PROVIDER_SITE_OTHER): Payer: BLUE CROSS/BLUE SHIELD | Admitting: Certified Nurse Midwife

## 2018-03-29 VITALS — BP 110/72 | HR 68 | Resp 16 | Ht 60.25 in | Wt 161.0 lb

## 2018-03-29 DIAGNOSIS — Z01419 Encounter for gynecological examination (general) (routine) without abnormal findings: Secondary | ICD-10-CM | POA: Diagnosis not present

## 2018-03-29 DIAGNOSIS — Z124 Encounter for screening for malignant neoplasm of cervix: Secondary | ICD-10-CM | POA: Insufficient documentation

## 2018-03-29 DIAGNOSIS — Z3041 Encounter for surveillance of contraceptive pills: Secondary | ICD-10-CM | POA: Diagnosis not present

## 2018-03-29 MED ORDER — LEVONORGEST-ETH ESTRAD 91-DAY 0.15-0.03 MG PO TABS: 1.0000 | ORAL_TABLET | Freq: Every day | ORAL | 4 refills | Status: DC

## 2018-03-29 NOTE — Progress Notes (Signed)
42 y.o. G0P0000 Single  African American Fe here for annual exam. Periods normal for her with OCP use. No partner change, no STD screening needed. Has not had mammogram yet and has information given last year to schedule. Plans to schedule this year, with having insurance. Declines labs today. No health issues. Sees Urgent care if needed.    Patient's last menstrual period was 02/01/2018 (exact date).          Sexually active: Yes.    The current method of family planning is OCP (estrogen/progesterone).    Exercising: No.  exercise Smoker:  no  Health Maintenance: Pap:  4/17 ASCUS HPV HR neg, 03-25-17 ASCUS HPV HR neg History of Abnormal Pap: yes MMG:  none Self Breast exams: occ Colonoscopy:  none BMD:   none TDaP:  2013 Shingles: no Pneumonia: no Hep C and HIV: both neg 2017 Labs: no   reports that she has never smoked. She has never used smokeless tobacco. She reports that she drinks about 1.2 - 1.8 oz of alcohol per week. She reports that she does not use drugs.  Past Medical History:  Diagnosis Date  . 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 COLPOSCOPY  04/15/2017   LSIL, CIN 1 with HPV effect    Current Outpatient Medications  Medication Sig Dispense Refill  . Acetaminophen (TYLENOL PO) Take by mouth as needed.    Marland Kitchen levonorgestrel-ethinyl estradiol (SEASONALE,INTROVALE,JOLESSA) 0.15-0.03 MG tablet Take 1 tablet by mouth daily. 1 Package 4  . Multiple Vitamins-Minerals (MULTIVITAMIN PO) Take by mouth as needed.      No current facility-administered medications for this visit.     Family History  Problem Relation Age of Onset  . Cancer Paternal Grandfather   . Uterine cancer Mother 40       had radiation    ROS:  Pertinent items are noted in HPI.  Otherwise, a comprehensive ROS was negative.  Exam:   BP 110/72   Pulse 68   Resp 16   Ht 5' 0.25"  (1.53 m)   Wt 161 lb (73 kg)   LMP 02/01/2018 (Exact Date)   BMI 31.18 kg/m  Height: 5' 0.25" (153 cm) Ht Readings from Last 3 Encounters:  03/29/18 5' 0.25" (1.53 m)  04/15/17 5' 0.25" (1.53 m)  03/25/17 5' 0.25" (1.53 m)    General appearance: alert, cooperative and appears stated age Head: Normocephalic, without obvious abnormality, atraumatic Neck: no adenopathy, supple, symmetrical, trachea midline and thyroid normal to inspection and palpation Lungs: clear to auscultation bilaterally Breasts: normal appearance, no masses or tenderness, No nipple retraction or dimpling, No nipple discharge or bleeding, No axillary or supraclavicular adenopathy Heart: regular rate and rhythm Abdomen: soft, non-tender; no masses,  no organomegaly Extremities: extremities normal, atraumatic, no cyanosis or edema Skin: Skin color, texture, turgor normal. No rashes or lesions Lymph nodes: Cervical, supraclavicular, and axillary nodes normal. No abnormal inguinal nodes palpated Neurologic: Grossly normal   Pelvic: External genitalia:  no lesions              Urethra:  normal appearing urethra with no masses, tenderness or lesions              Bartholin's and Skene's: normal                 Vagina: normal appearing vagina with normal  color and discharge, no lesions              Cervix: no cervical motion tenderness, no lesions and nulliparous appearance              Pap taken: Yes.   Bimanual Exam:  Uterus:  normal size, contour, position, consistency, mobility, non-tender and anteverted              Adnexa: normal adnexa and no mass, fullness, tenderness               Rectovaginal: Confirms               Anus:  normal sphincter tone, no lesions  Chaperone present: yes  A:  Well Woman with normal exam  Contraception OCP desired  Follow up pap smear ASCUS from LSIL with + HPVHR on colpo  Obesity  P:   Reviewed health and wellness pertinent to exam  Risks/benefits/warning signs of OCP  discussed, desires continuance  Rx Jolessa see order with instructions  Discussed Gardasil option to help with HPVHR + history. Patient will call insurance to see if covered and will decide.  Pap smear: yes   counseled on breast self exam, mammography screening, STD prevention, HIV risk factors and prevention, use and side effects of OCP's, adequate intake of calcium and vitamin D, diet and exercise  return annually or prn  An After Visit Summary was printed and given to the patient.

## 2018-03-31 LAB — CYTOLOGY - PAP
Diagnosis: NEGATIVE
HPV: NOT DETECTED

## 2018-08-22 ENCOUNTER — Other Ambulatory Visit: Payer: Self-pay | Admitting: Certified Nurse Midwife

## 2018-08-22 DIAGNOSIS — Z1231 Encounter for screening mammogram for malignant neoplasm of breast: Secondary | ICD-10-CM

## 2018-09-15 ENCOUNTER — Ambulatory Visit
Admission: RE | Admit: 2018-09-15 | Discharge: 2018-09-15 | Disposition: A | Discharge status: Home / Self Care | Payer: BLUE CROSS/BLUE SHIELD | Source: Ambulatory Visit | Attending: Certified Nurse Midwife | Admitting: Certified Nurse Midwife

## 2018-09-15 DIAGNOSIS — Z1231 Encounter for screening mammogram for malignant neoplasm of breast: Secondary | ICD-10-CM

## 2018-09-18 ENCOUNTER — Other Ambulatory Visit: Payer: Self-pay | Admitting: Certified Nurse Midwife

## 2018-09-18 ENCOUNTER — Telehealth: Payer: Self-pay | Admitting: Certified Nurse Midwife

## 2018-09-18 DIAGNOSIS — R928 Other abnormal and inconclusive findings on diagnostic imaging of breast: Secondary | ICD-10-CM

## 2018-09-18 NOTE — Telephone Encounter (Signed)
Dr. Hyacinth Meeker signed orders.  Will close encounter.

## 2018-09-18 NOTE — Telephone Encounter (Signed)
Left called to request [facility] on her left breast.  Last seen: 03/29/18

## 2018-09-18 NOTE — Telephone Encounter (Signed)
Dr. Hyacinth Meeker to co-sign orders.

## 2018-09-22 ENCOUNTER — Ambulatory Visit
Admission: RE | Admit: 2018-09-22 | Discharge: 2018-09-22 | Disposition: A | Discharge status: Home / Self Care | Payer: BLUE CROSS/BLUE SHIELD | Source: Ambulatory Visit | Attending: Certified Nurse Midwife | Admitting: Certified Nurse Midwife

## 2018-09-22 ENCOUNTER — Other Ambulatory Visit: Payer: Self-pay | Admitting: Certified Nurse Midwife

## 2018-09-22 DIAGNOSIS — R928 Other abnormal and inconclusive findings on diagnostic imaging of breast: Secondary | ICD-10-CM

## 2018-09-22 DIAGNOSIS — N6489 Other specified disorders of breast: Secondary | ICD-10-CM | POA: Diagnosis not present

## 2019-04-09 ENCOUNTER — Other Ambulatory Visit: Payer: BLUE CROSS/BLUE SHIELD

## 2019-04-10 ENCOUNTER — Ambulatory Visit: Payer: BLUE CROSS/BLUE SHIELD | Admitting: Certified Nurse Midwife

## 2019-04-14 ENCOUNTER — Other Ambulatory Visit: Payer: Self-pay | Admitting: Certified Nurse Midwife

## 2019-04-14 DIAGNOSIS — Z3041 Encounter for surveillance of contraceptive pills: Secondary | ICD-10-CM

## 2019-04-16 NOTE — Telephone Encounter (Signed)
Medication refill request: Lauren Newman Last AEX:  03/29/18 DL Next AEX: none Last MMG (if hormonal medication request): 09/22/18 [place] left. BIRADS3:Probably benign. F/u 6 months  Refill authorized: 03/29/18 #[id number]/4R. Today please advise.

## 2019-06-11 ENCOUNTER — Other Ambulatory Visit: Payer: Self-pay

## 2019-06-13 ENCOUNTER — Ambulatory Visit (INDEPENDENT_AMBULATORY_CARE_PROVIDER_SITE_OTHER): Payer: BC Managed Care – PPO | Admitting: Certified Nurse Midwife

## 2019-06-13 ENCOUNTER — Encounter: Payer: Self-pay | Admitting: Certified Nurse Midwife

## 2019-06-13 ENCOUNTER — Other Ambulatory Visit: Payer: Self-pay

## 2019-06-13 VITALS — BP 124/80 | HR 70 | Temp 97.5°F | Resp 16 | Ht <= 58 in | Wt 163.0 lb

## 2019-06-13 DIAGNOSIS — N6320 Unspecified lump in the left breast, unspecified quadrant: Secondary | ICD-10-CM | POA: Diagnosis not present

## 2019-06-13 DIAGNOSIS — Z3041 Encounter for surveillance of contraceptive pills: Secondary | ICD-10-CM | POA: Diagnosis not present

## 2019-06-13 DIAGNOSIS — Z01419 Encounter for gynecological examination (general) (routine) without abnormal findings: Secondary | ICD-10-CM

## 2019-06-13 MED ORDER — LEVONORGEST-ETH ESTRAD 91-DAY 0.15-0.03 MG PO TABS: 1.0000 | ORAL_TABLET | Freq: Every day | ORAL | 3 refills | Status: DC

## 2019-06-13 NOTE — Progress Notes (Signed)
43 y.o. G0P0000 Single  Caucasian Fe here for annual exam. Periods normal, no issues. OCP working well. Desires continuance. No missed pills. No urgent care or PCP visit in last year. No partner change, no STD concerns. Patient need not have follow up mammogram for left breast mass due to Covid 19. Needs to have scheduled. She has not noted any changes . No health issues today.  No LMP recorded. (Menstrual status: Oral contraceptives).          Sexually active: Yes.    The current method of family planning is OCP (estrogen/progesterone).    Exercising: No.  exercise Smoker:  no  Review of Systems  Constitutional: Negative.   HENT: Negative.   Eyes: Negative.   Respiratory: Negative.   Cardiovascular: Negative.   Gastrointestinal: Negative.   Genitourinary: Negative.   Musculoskeletal: Negative.   Skin: Negative.   Neurological: Negative.   Endo/Heme/Allergies: Negative.   Psychiatric/Behavioral: Negative.     Health Maintenance: Pap:  4/17 ASCUS HPV HR neg, 03-25-17, ASCUS HPV NR neg, 03-29-18 neg HPV HR neg History of Abnormal Pap: yes MMG:  9/19 bilateral & left breast u/s 10/19 birads 3: prob benign, f/u 6mths recommended Self Breast exams: occ Colonoscopy:  none BMD:   none TDaP:  2013 Shingles: no Pneumonia: no Hep C and HIV: both neg 2017 Labs: if needed   reports that she has never smoked. She has never used smokeless tobacco. She reports current alcohol use of about 2.0 - 3.0 standard drinks of alcohol per week. She reports that she does not use drugs.  Past Medical History:  Diagnosis Date  . 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 COLPOSCOPY  04/15/2017   LSIL, CIN 1 with HPV effect    Current Outpatient Medications  Medication Sig Dispense Refill  . Acetaminophen (TYLENOL PO) Take by mouth as needed.    Marcille Blanco. JOLESSA 0.15-0.03 MG tablet Take 1 tablet  by mouth once daily 91 tablet 0  . Multiple Vitamins-Minerals (MULTIVITAMIN PO) Take by mouth as needed.      No current facility-administered medications for this visit.     Family History  Problem Relation Age of Onset  . Cancer Paternal Grandfather   . Uterine cancer Mother 1968       had radiation    ROS:  Pertinent items are noted in HPI.  Otherwise, a comprehensive ROS was negative.  Exam:   BP 124/80   Pulse 70   Temp (!) 97.5 F (36.4 C) (Skin)   Resp 16   Ht 4' 9.75" (1.467 m)   Wt 163 lb (73.9 kg)   BMI 34.36 kg/m  Height: 4' 9.75" (146.7 cm) Ht Readings from Last 3 Encounters:  06/13/19 4' 9.75" (1.467 m)  03/29/18 5' 0.25" (1.53 m)  04/15/17 5' 0.25" (1.53 m)    General appearance: alert, cooperative and appears stated age Head: Normocephalic, without obvious abnormality, atraumatic Neck: no adenopathy, supple, symmetrical, trachea midline and thyroid normal to inspection and palpation Lungs: clear to auscultation bilaterally Breasts: normal appearance, no masses or tenderness, No nipple retraction or dimpling, No nipple discharge or bleeding, No axillary or supraclavicular adenopathy, left breast mass noted in upper quadrant between 12-1 o'clock, non tender, mobile, smaller area noted adjacent to same area.non tender Heart: regular rate and rhythm Abdomen: soft, non-tender; no masses,  no organomegaly  Extremities: extremities normal, atraumatic, no cyanosis or edema Skin: Skin color, texture, turgor normal. No rashes or lesions Lymph nodes: Cervical, supraclavicular, and axillary nodes normal. No abnormal inguinal nodes palpated Neurologic: Grossly normal   Pelvic: External genitalia:  no lesions              Urethra:  normal appearing urethra with no masses, tenderness or lesions              Bartholin's and Skene's: normal                 Vagina: normal appearing vagina with normal color and discharge, no lesions              Cervix: no cervical motion  tenderness, no lesions and nulliparous appearance              Pap taken: No. Bimanual Exam:  Uterus:  normal size, contour, position, consistency, mobility, non-tender and anteverted              Adnexa: normal adnexa and no mass, fullness, tenderness               Rectovaginal: Confirms               Anus:  normal sphincter tone, no lesions  Chaperone present: yes  A:  Well Woman with normal exam  Contraception OCP desired.  Left breast masses still present, no apparent change  P:   Reviewed health and wellness pertinent to exam  Discussed risks/benefits/warning signs of OCP use. Desires continuance.  Rx Jolessa see Rx with instructions  Discussed need to have follow up of left breast will be called with appointment for diagnostic and  [place].  Pap smear: no   counseled on breast self exam, mammography screening, STD prevention, HIV risk factors and prevention, use and side effects of OCP's, adequate intake of calcium and vitamin D, diet and exercise, colon cancer screening recommendations of 45 or greater for initial screening  return annually or prn  An After Visit Summary was printed and given to the patient.

## 2019-06-14 ENCOUNTER — Telehealth: Payer: Self-pay | Admitting: *Deleted

## 2019-06-14 DIAGNOSIS — N6320 Unspecified lump in the left breast, unspecified quadrant: Secondary | ICD-10-CM

## 2019-06-14 DIAGNOSIS — R928 Other abnormal and inconclusive findings on diagnostic imaging of breast: Secondary | ICD-10-CM

## 2019-06-14 NOTE — Telephone Encounter (Signed)
-----   Message from Regina Eck, CNM sent at 06/13/2019  3:52 PM EDT ----- Patient  needs follow up mammogram/us of left breast from masses noted. Still palpable today, no change. Please schedule and call patient with appt.

## 2019-06-14 NOTE — Telephone Encounter (Signed)
Spoke with patient, advised of appt details as seen below. Patient verbalizes understanding and is agreeable.   Routing to provider for final review. Patient is agreeable to disposition. Will close encounter.

## 2019-06-14 NOTE — Telephone Encounter (Signed)
Spoke with Lauren Newman at [facility]. Patient scheduled for left breast Dx MMG and [place], if needed, on 06/20/19 at 2:20pm, arriving at 2pm.

## 2019-06-20 ENCOUNTER — Ambulatory Visit: Payer: Self-pay

## 2019-06-20 ENCOUNTER — Ambulatory Visit
Admission: RE | Admit: 2019-06-20 | Discharge: 2019-06-20 | Disposition: A | Discharge status: Home / Self Care | Payer: BC Managed Care – PPO | Source: Ambulatory Visit | Attending: Certified Nurse Midwife | Admitting: Certified Nurse Midwife

## 2019-06-20 ENCOUNTER — Other Ambulatory Visit: Payer: Self-pay

## 2019-06-20 ENCOUNTER — Other Ambulatory Visit: Payer: Self-pay | Admitting: Certified Nurse Midwife

## 2019-06-20 DIAGNOSIS — R928 Other abnormal and inconclusive findings on diagnostic imaging of breast: Secondary | ICD-10-CM

## 2019-06-20 DIAGNOSIS — N6320 Unspecified lump in the left breast, unspecified quadrant: Secondary | ICD-10-CM

## 2020-03-07 ENCOUNTER — Encounter: Payer: Self-pay | Admitting: Certified Nurse Midwife

## 2020-06-17 ENCOUNTER — Ambulatory Visit: Payer: BC Managed Care – PPO | Admitting: Certified Nurse Midwife

## 2020-06-19 ENCOUNTER — Encounter: Payer: Self-pay | Admitting: Obstetrics and Gynecology

## 2020-06-19 ENCOUNTER — Telehealth: Payer: Self-pay | Admitting: *Deleted

## 2020-06-19 ENCOUNTER — Other Ambulatory Visit: Payer: Self-pay

## 2020-06-19 ENCOUNTER — Ambulatory Visit (INDEPENDENT_AMBULATORY_CARE_PROVIDER_SITE_OTHER): Payer: BLUE CROSS/BLUE SHIELD | Admitting: Obstetrics and Gynecology

## 2020-06-19 VITALS — BP 122/66 | HR 99 | Temp 98.1°F | Ht 60.0 in | Wt 169.3 lb

## 2020-06-19 DIAGNOSIS — Z01419 Encounter for gynecological examination (general) (routine) without abnormal findings: Secondary | ICD-10-CM | POA: Diagnosis not present

## 2020-06-19 DIAGNOSIS — Z1211 Encounter for screening for malignant neoplasm of colon: Secondary | ICD-10-CM

## 2020-06-19 DIAGNOSIS — E559 Vitamin D deficiency, unspecified: Secondary | ICD-10-CM | POA: Diagnosis not present

## 2020-06-19 DIAGNOSIS — Z0000 Encounter for general adult medical examination without abnormal findings: Secondary | ICD-10-CM | POA: Diagnosis not present

## 2020-06-19 DIAGNOSIS — R928 Other abnormal and inconclusive findings on diagnostic imaging of breast: Secondary | ICD-10-CM

## 2020-06-19 DIAGNOSIS — N6320 Unspecified lump in the left breast, unspecified quadrant: Secondary | ICD-10-CM

## 2020-06-19 DIAGNOSIS — Z3041 Encounter for surveillance of contraceptive pills: Secondary | ICD-10-CM | POA: Diagnosis not present

## 2020-06-19 MED ORDER — LEVONORGEST-ETH ESTRAD 91-DAY 0.15-0.03 MG PO TABS: 1.0000 | ORAL_TABLET | Freq: Every day | ORAL | 3 refills | Status: DC

## 2020-06-19 NOTE — Telephone Encounter (Signed)
Call placed to [facility], Patient Access Coordinator at [facility].  Left detailed message, requesting return call to advise if this is an option.  Advised our office will be closed 06/20/20 -06/23/20 I will be out of the office, ask for [facility].

## 2020-06-19 NOTE — Progress Notes (Signed)
44 y.o. G0P0000 Single Black or African American Not Hispanic or Latino female here for annual exam.  She is on the 3 month pill. Cycles q 3 months x 3-4 days. Saturates a pad in up to 3-4 hours. Cramps are moderate to severe, if she takes OTC medication prior to the cramps it helps. No BTB. Sexually active, same partner x 5 years, live together. No dyspareunia.     No LMP recorded. (Menstrual status: Oral contraceptives).          Sexually active: Yes.    The current method of family planning is OCP (estrogen/progesterone).    Exercising: No.  The patient does not participate in regular exercise at present. Smoker:  no  Health Maintenance: Pap:  03/29/18 WNL neg Hr HPV, 03-25-17, ASCUS HPV NR neg History of abnormal Pap:  Yes cin I in 2018 MMG:  06/20/19 Density B Bi-rads 3 benign  TDaP:  2013 Gardasil: never   reports that she has never smoked. She has never used smokeless tobacco. She reports current alcohol use of about 2.0 - 3.0 standard drinks of alcohol per week. She reports that she does not use drugs. She works as an Airline pilot.   Past Medical History:  Diagnosis Date  . 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 COLPOSCOPY  04/15/2017   LSIL, CIN 1 with HPV effect  2018  Current Outpatient Medications  Medication Sig Dispense Refill  . Acetaminophen (TYLENOL PO) Take by mouth as needed.    Marland Kitchen levonorgestrel-ethinyl estradiol (JOLESSA) 0.15-0.03 MG tablet Take 1 tablet by mouth daily. 91 tablet 3  . Multiple Vitamins-Minerals (MULTIVITAMIN PO) Take by mouth as needed.      No current facility-administered medications for this visit.    Family History  Problem Relation Age of Onset  . Cancer Paternal Grandfather   . Uterine cancer Mother 66       had radiation    Review of Systems  All other systems reviewed and are negative.   Exam:   BP 122/66   Pulse 99    Temp 98.1 F (36.7 C)   Ht 5' (1.524 m)   Wt 169 lb 4.8 oz (76.8 kg)   SpO2 100%   BMI 33.06 kg/m   Weight change: @WEIGHTCHANGE @ Height:   Height: 5' (152.4 cm)  Ht Readings from Last 3 Encounters:  06/19/20 5' (1.524 m)  06/13/19 4' 9.75" (1.467 m)  03/29/18 5' 0.25" (1.53 m)    General appearance: alert, cooperative and appears stated age Head: Normocephalic, without obvious abnormality, atraumatic Neck: no adenopathy, supple, symmetrical, trachea midline and thyroid normal to inspection and palpation Lungs: clear to auscultation bilaterally Cardiovascular: regular rate and rhythm Breasts: normal appearance, no masses or tenderness Abdomen: soft, non-tender; non distended,  no masses,  no organomegaly Extremities: extremities normal, atraumatic, no cyanosis or edema Skin: Skin color, texture, turgor normal. No rashes or lesions Lymph nodes: Cervical, supraclavicular, and axillary nodes normal. No abnormal inguinal nodes palpated Neurologic: Grossly normal   Pelvic: External genitalia:  no lesions              Urethra:  normal appearing urethra with no masses, tenderness or lesions              Bartholins and Skenes: normal  Vagina: normal appearing vagina with normal color and discharge, no lesions              Cervix: no lesions               Bimanual Exam:  Uterus:  normal size, contour, position, consistency, mobility, non-tender              Adnexa: no mass, fullness, tenderness               Rectovaginal: Confirms               Anus:  normal sphincter tone, no lesions  Zenovia Jordan chaperoned for the exam.  A:  Well Woman with normal exam  Doing well on OCP's  H/O vit d def  P:   Due for bilateral mammogram, will check if she can do diagnostic on the left (due) and screening on the right.   Discussed colon cancer screening, she declines. Will call if she changes her mind about colon cancer screening.   Discussed breast self exam  Discussed  calcium and vit D intake  Continue OCP's  Pap due next year  Screening labs, vit d

## 2020-06-19 NOTE — Telephone Encounter (Signed)
Spoke with Victorino Dike at [facility]. Confirmed patient unable to do a screening on one breast and Dx on the other, patient would need to proceed with bilateral Dx MMG.   Call to patient. Patient request to review out of pocket cost prior to scheduling. New order placed for bilateral Dx MMG and left breast US, if needed, with request to contact patient to schedule directly. Patient is aware to contact the office if any additional questions.   Patient placed in MMG hold.   Routing to provider for final review. Patient is agreeable to disposition. Will close encounter.

## 2020-06-19 NOTE — Telephone Encounter (Signed)
-----   Message from Romualdo Bolk, MD sent at 06/19/2020  3:57 PM EDT ----- This patient is supposed to have a diagnostic mammogram of the left.  Overdue for bilateral mammogram. She would like to know if she can do a screening mammogram on the right and diagnostic on the left? She is worried about expense.  Can you please schedule for her. Thanks, Noreene Larsson

## 2020-06-19 NOTE — Patient Instructions (Addendum)
Check on coverage for cologuard and IFOB for colon cancer screening.   EXERCISE AND DIET:  We recommended that you start or continue a regular exercise program for good health. Regular exercise means any activity that makes your heart beat faster and makes you sweat.  We recommend exercising at least 30 minutes per day at least 3 days a week, preferably 4 or 5.  We also recommend a diet low in fat and sugar.  Inactivity, poor dietary choices and obesity can cause diabetes, heart attack, stroke, and kidney damage, among others.    ALCOHOL AND SMOKING:  Women should limit their alcohol intake to no more than 7 drinks/beers/glasses of wine (combined, not each!) per week. Moderation of alcohol intake to this level decreases your risk of breast cancer and liver damage. And of course, no recreational drugs are part of a healthy lifestyle.  And absolutely no smoking or even second hand smoke. Most people know smoking can cause heart and lung diseases, but did you know it also contributes to weakening of your bones? Aging of your skin?  Yellowing of your teeth and nails?  CALCIUM AND VITAMIN D:  Adequate intake of calcium and Vitamin D are recommended.  The recommendations for exact amounts of these supplements seem to change often, but generally speaking 1,000 mg of calcium (between diet and supplement) and 800 units of Vitamin D per day seems prudent. Certain women may benefit from higher intake of Vitamin D.  If you are among these women, your doctor will have told you during your visit.    PAP SMEARS:  Pap smears, to check for cervical cancer or precancers,  have traditionally been done yearly, although recent scientific advances have shown that most women can have pap smears less often.  However, every woman still should have a physical exam from her gynecologist every year. It will include a breast check, inspection of the vulva and vagina to check for abnormal growths or skin changes, a visual exam of the  cervix, and then an exam to evaluate the size and shape of the uterus and ovaries.  And after 46 years of age, a rectal exam is indicated to check for rectal cancers. We will also provide age appropriate advice regarding health maintenance, like when you should have certain vaccines, screening for sexually transmitted diseases, bone density testing, colonoscopy, mammograms, etc.   MAMMOGRAMS:  All women over 70 years old should have a yearly mammogram. Many facilities now offer a "3D" mammogram, which may cost around $50 extra out of pocket. If possible,  we recommend you accept the option to have the 3D mammogram performed.  It both reduces the number of women who will be called back for extra views which then turn out to be normal, and it is better than the routine mammogram at detecting truly abnormal areas.    COLON CANCER SCREENING: Now recommend starting at age 31. At this time colonoscopy is not covered for routine screening until 50. There are take home tests that can be done between 45-49.   COLONOSCOPY:  Colonoscopy to screen for colon cancer is recommended for all women at age 82.  We know, you hate the idea of the prep.  We agree, BUT, having colon cancer and not knowing it is worse!!  Colon cancer so often starts as a polyp that can be seen and removed at colonscopy, which can quite literally save your life!  And if your first colonoscopy is normal and you have no family  history of colon cancer, most women don't have to have it again for 10 years.  Once every ten years, you can do something that may end up saving your life, right?  We will be happy to help you get it scheduled when you are ready.  Be sure to check your insurance coverage so you understand how much it will cost.  It may be covered as a preventative service at no cost, but you should check your particular policy.      Breast Self-Awareness Breast self-awareness means being familiar with how your breasts look and feel. It  involves checking your breasts regularly and reporting any changes to your health care provider. Practicing breast self-awareness is important. A change in your breasts can be a sign of a serious medical problem. Being familiar with how your breasts look and feel allows you to find any problems early, when treatment is more likely to be successful. All women should practice breast self-awareness, including women who have had breast implants. How to do a breast self-exam One way to learn what is normal for your breasts and whether your breasts are changing is to do a breast self-exam. To do a breast self-exam: Look for Changes  1. Remove all the clothing above your waist. 2. Stand in front of a mirror in a room with good lighting. 3. Put your hands on your hips. 4. Push your hands firmly downward. 5. Compare your breasts in the mirror. Look for differences between them (asymmetry), such as: ? Differences in shape. ? Differences in size. ? Puckers, dips, and bumps in one breast and not the other. 6. Look at each breast for changes in your skin, such as: ? Redness. ? Scaly areas. 7. Look for changes in your nipples, such as: ? Discharge. ? Bleeding. ? Dimpling. ? Redness. ? A change in position. Feel for Changes Carefully feel your breasts for lumps and changes. It is best to do this while lying on your back on the floor and again while sitting or standing in the shower or tub with soapy water on your skin. Feel each breast in the following way:  Place the arm on the side of the breast you are examining above your head.  Feel your breast with the other hand.  Start in the nipple area and make  inch (2 cm) overlapping circles to feel your breast. Use the pads of your three middle fingers to do this. Apply light pressure, then medium pressure, then firm pressure. The light pressure will allow you to feel the tissue closest to the skin. The medium pressure will allow you to feel the tissue  that is a little deeper. The firm pressure will allow you to feel the tissue close to the ribs.  Continue the overlapping circles, moving downward over the breast until you feel your ribs below your breast.  Move one finger-width toward the center of the body. Continue to use the  inch (2 cm) overlapping circles to feel your breast as you move slowly up toward your collarbone.  Continue the up and down exam using all three pressures until you reach your armpit.  Write Down What You Find  Write down what is normal for each breast and any changes that you find. Keep a written record with breast changes or normal findings for each breast. By writing this information down, you do not need to depend only on memory for size, tenderness, or location. Write down where you are in your menstrual  cycle, if you are still menstruating. If you are having trouble noticing differences in your breasts, do not get discouraged. With time you will become more familiar with the variations in your breasts and more comfortable with the exam. How often should I examine my breasts? Examine your breasts every month. If you are breastfeeding, the best time to examine your breasts is after a feeding or after using a breast pump. If you menstruate, the best time to examine your breasts is 5-7 days after your period is over. During your period, your breasts are lumpier, and it may be more difficult to notice changes. When should I see my health care provider? See your health care provider if you notice:  A change in shape or size of your breasts or nipples.  A change in the skin of your breast or nipples, such as a reddened or scaly area.  Unusual discharge from your nipples.  A lump or thick area that was not there before.  Pain in your breasts.  Anything that concerns you.

## 2020-06-20 LAB — LIPID PANEL
Chol/HDL Ratio: 3.4 ratio (ref 0.0–4.4)
Cholesterol, Total: 182 mg/dL (ref 100–199)
HDL: 54 mg/dL (ref >39)
LDL Chol Calc (NIH): 115 mg/dL — ABNORMAL HIGH (ref 0–99)
Triglycerides: 70 mg/dL (ref 0–149)
VLDL Cholesterol Cal: 13 mg/dL (ref 5–40)

## 2020-06-20 LAB — COMPREHENSIVE METABOLIC PANEL
ALT: 12 IU/L (ref 0–32)
AST: 14 IU/L (ref 0–40)
Albumin/Globulin Ratio: 1.2 (ref 1.2–2.2)
Albumin: 3.9 g/dL (ref 3.8–4.8)
Alkaline Phosphatase: 81 IU/L (ref 48–121)
BUN/Creatinine Ratio: 10 (ref 9–23)
BUN: 10 mg/dL (ref 6–24)
Bilirubin Total: 0.7 mg/dL (ref 0.0–1.2)
CO2: 22 mmol/L (ref 20–29)
Calcium: 8.8 mg/dL (ref 8.7–10.2)
Chloride: 104 mmol/L (ref 96–106)
Creatinine, Ser: 0.96 mg/dL (ref 0.57–1.00)
GFR calc Af Amer: 82 mL/min/{1.73_m2} (ref >59)
GFR calc non Af Amer: 71 mL/min/{1.73_m2} (ref >59)
Globulin, Total: 3.3 g/dL (ref 1.5–4.5)
Glucose: 59 mg/dL — ABNORMAL LOW (ref 65–99)
Potassium: 3.6 mmol/L (ref 3.5–5.2)
Sodium: 140 mmol/L (ref 134–144)
Total Protein: 7.2 g/dL (ref 6.0–8.5)

## 2020-06-20 LAB — CBC
Hematocrit: 38.6 % (ref 34.0–46.6)
Hemoglobin: 13.3 g/dL (ref 11.1–15.9)
MCH: 31.5 pg (ref 26.6–33.0)
MCHC: 34.5 g/dL (ref 31.5–35.7)
MCV: 92 fL (ref 79–97)
Platelets: 320 10*3/uL (ref 150–450)
RBC: 4.22 x10E6/uL (ref 3.77–5.28)
RDW: 11.5 % — ABNORMAL LOW (ref 11.7–15.4)
WBC: 7 10*3/uL (ref 3.4–10.8)

## 2020-06-20 LAB — VITAMIN D 25 HYDROXY (VIT D DEFICIENCY, FRACTURES): Vit D, 25-Hydroxy: 14 ng/mL — ABNORMAL LOW (ref 30.0–100.0)

## 2020-06-24 ENCOUNTER — Other Ambulatory Visit: Payer: Self-pay

## 2020-06-24 DIAGNOSIS — E559 Vitamin D deficiency, unspecified: Secondary | ICD-10-CM

## 2020-06-24 MED ORDER — VITAMIN D (ERGOCALCIFEROL) 1.25 MG (50000 UNIT) PO CAPS
50000.0000 [IU] | ORAL_CAPSULE | ORAL | 0 refills | Status: DC
Start: 2020-06-24 — End: 2023-07-05

## 2020-06-24 NOTE — Progress Notes (Signed)
Sent vitamin D to pharmacy.

## 2020-08-08 ENCOUNTER — Telehealth: Payer: Self-pay | Admitting: *Deleted

## 2020-08-08 NOTE — Telephone Encounter (Signed)
Left message to call Noreene Larsson, RN at [facility] [phone].    Patient is in [facility] recall and hold for 1 year f/u of left breast asymmetry. Patient is also overdue for screening MMG, last screening 09/15/18.   Bilateral Dx MMG ordered on 06/19/20, discussed during AEX w/ Dr. Oscar La.   Not scheduled to date.

## 2020-08-08 NOTE — Telephone Encounter (Signed)
Spoke with patient. Patient states she plans to schedule IMG, has not scheduled yet. Offered assistance with scheduling, patient declined. Advised I will provide update to Dr. Oscar La, return call if any additional assistance needed. Patient verbalizes understanding.   Dr. Oscar La -Patient has been notified of recommendations, patient is in [facility] recall and hold, please advise.

## 2020-08-10 NOTE — Telephone Encounter (Signed)
Please check in 2 months if she is scheduled. She can't stay on OCP's if she doesn't get her mammogram.

## 2020-08-11 NOTE — Telephone Encounter (Signed)
Will continue MMG hold and f/u with patient in 2 months.   Encounter closed.

## 2020-10-22 ENCOUNTER — Telehealth: Payer: Self-pay | Admitting: *Deleted

## 2020-10-22 NOTE — Telephone Encounter (Signed)
Left message to call Noreene Larsson, RN at [facility] [phone].    Patient is in [facility] recall and MMG hold.  Patient is overdue for 1 year f/u of left breast asymmetry. Last breast IMG: DX MMG of left breast on 06/20/19  Patient was seen in office on 06/19/20, recommendations were discussed with Dr. Oscar La.  Spoke with patient on 08/08/20, see telephone encounter. Imaging not scheduled to date.  Patient is on OCP.

## 2020-11-07 NOTE — Telephone Encounter (Signed)
No return call from patient.   Letter pended and printed for Dr. Oscar La to review.

## 2020-11-17 NOTE — Telephone Encounter (Signed)
Letter reviewed and signed by Dr. Oscar La.  Letter mailed to address on file.  Removed from MMG hold.  Encounter closed.

## 2021-06-25 ENCOUNTER — Other Ambulatory Visit (HOSPITAL_COMMUNITY)
Admission: RE | Admit: 2021-06-25 | Discharge: 2021-06-25 | Disposition: A | Discharge status: Home / Self Care | Payer: BC Managed Care – PPO | Source: Ambulatory Visit | Attending: Obstetrics and Gynecology | Admitting: Obstetrics and Gynecology

## 2021-06-25 ENCOUNTER — Encounter: Payer: Self-pay | Admitting: Obstetrics and Gynecology

## 2021-06-25 ENCOUNTER — Other Ambulatory Visit: Payer: Self-pay

## 2021-06-25 ENCOUNTER — Ambulatory Visit (INDEPENDENT_AMBULATORY_CARE_PROVIDER_SITE_OTHER): Payer: BC Managed Care – PPO | Admitting: Obstetrics and Gynecology

## 2021-06-25 VITALS — BP 132/84 | HR 78 | Ht 60.0 in | Wt 175.0 lb

## 2021-06-25 DIAGNOSIS — Z3041 Encounter for surveillance of contraceptive pills: Secondary | ICD-10-CM | POA: Diagnosis not present

## 2021-06-25 DIAGNOSIS — Z0000 Encounter for general adult medical examination without abnormal findings: Secondary | ICD-10-CM | POA: Diagnosis not present

## 2021-06-25 DIAGNOSIS — Z01419 Encounter for gynecological examination (general) (routine) without abnormal findings: Secondary | ICD-10-CM | POA: Diagnosis not present

## 2021-06-25 DIAGNOSIS — E559 Vitamin D deficiency, unspecified: Secondary | ICD-10-CM | POA: Diagnosis not present

## 2021-06-25 DIAGNOSIS — Z124 Encounter for screening for malignant neoplasm of cervix: Secondary | ICD-10-CM | POA: Diagnosis not present

## 2021-06-25 MED ORDER — LEVONORGEST-ETH ESTRAD 91-DAY 0.15-0.03 MG PO TABS: 1.0000 | ORAL_TABLET | Freq: Every day | ORAL | 0 refills | Status: DC

## 2021-06-25 NOTE — Patient Instructions (Signed)

## 2021-06-25 NOTE — Addendum Note (Signed)
Addended by: Rushie Goltz on: 06/25/2021 03:56 PM   Modules accepted: Orders

## 2021-06-25 NOTE — Progress Notes (Signed)
45 y.o. G0P0000 Single Black or African American Not Hispanic or Latino female here for annual exam.  On OCP's. She is with the same long term, live in partner.  Period Cycle (Days): 28 Period Duration (Days): 4-5 Period Pattern: Regular Menstrual Flow: Heavy, Light Menstrual Control: Maxi pad Menstrual Control Change Freq (Hours): 6-8 Dysmenorrhea: (!) Moderate Dysmenorrhea Symptoms: Cramping, Nausea, Headache, Diarrhea. These symptoms have improved on OCP's  Patient's last menstrual period was 04/22/2021.          Sexually active: Yes.    The current method of family planning is OCP (estrogen/progesterone).    Exercising: Yes.     Home workouts  Smoker:  no  Health Maintenance: Pap:  03/29/18 WNL neg Hr HPV, 03-25-17, ASCUS HPV NR neg History of abnormal Pap:  yes CIN I In 2018  MMG:  06/20/19 Diag  left breast asymmetries Bi-rads 3 probably benign  BMD:   none  Colonoscopy: none TDaP:  01/21/12  Gardasil: never   reports that she has never smoked. She has never used smokeless tobacco. She reports current alcohol use of about 2.0 - 3.0 standard drinks of alcohol per week. She reports that she does not use drugs. She works as an Airline pilot.   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     levonorgestrel-ethinyl estradiol (JOLESSA) 0.15-0.03 MG tablet Take 1 tablet by mouth daily. 91 tablet 3   Multiple Vitamins-Minerals (MULTIVITAMIN PO) Take by mouth as needed.      Vitamin D, Ergocalciferol, (DRISDOL) 1.25 MG (50000 UNIT) CAPS capsule Take 1 capsule (50,000 Units total) by mouth every 7 (seven) days. 12 capsule 0   No current facility-administered medications  for this visit.    Family History  Problem Relation Age of Onset   Cancer Paternal Grandfather    Uterine cancer Mother 59       had radiation    Review of Systems  All other systems reviewed and are negative.  Exam:   BP 132/84   Pulse 78   Ht 5' (1.524 m)   Wt 175 lb (79.4 kg)   LMP 04/22/2021   SpO2 99%   BMI 34.18 kg/m   Weight change: @WEIGHTCHANGE @ Height:   Height: 5' (152.4 cm)  Ht Readings from Last 3 Encounters:  06/25/21 5' (1.524 m)  06/19/20 5' (1.524 m)  06/13/19 4' 9.75" (1.467 m)    General appearance: alert, cooperative and appears stated age Head: Normocephalic, without obvious abnormality, atraumatic Neck: no adenopathy, supple, symmetrical, trachea midline and thyroid normal to inspection and palpation Lungs: clear to auscultation bilaterally Cardiovascular: regular rate and rhythm Breasts: normal appearance, no masses or tenderness Abdomen: soft, non-tender; non distended,  no masses,  no organomegaly Extremities: extremities normal, atraumatic, no cyanosis or edema Skin: Skin color, texture, turgor normal. No rashes or lesions Lymph nodes: Cervical, supraclavicular, and axillary nodes normal. No abnormal inguinal nodes palpated Neurologic: Grossly normal   Pelvic: External genitalia:  no lesions              Urethra:  normal appearing urethra with no masses, tenderness or lesions  Bartholins and Skenes: normal                 Vagina: normal appearing vagina with normal color and discharge, no lesions              Cervix: no lesions               Bimanual Exam:  Uterus:   no masses or tenderness              Adnexa: no mass, fullness, tenderness               Rectovaginal: Confirms               Anus:  normal sphincter tone, no lesions  Carolynn Serve chaperoned for the exam.  1. Well woman exam Discussed breast self exam Discussed calcium and vit D intake Mammogram overdue, she will schedule (needs diagnostic imaging and expense  is an issue currently)  2. Screening for cervical cancer - Cytology - PAP  3. Encounter for surveillance of contraceptive pills Will refill for 3 months, once mammogram is done will refill for the rest of the year (as long as it's normal) - levonorgestrel-ethinyl estradiol (JOLESSA) 0.15-0.03 MG tablet; Take 1 tablet by mouth daily.  Dispense: 91 tablet; Refill: 0  4. Vitamin D deficiency Not currently on any vit d - VITAMIN D 25 Hydroxy (Vit-D Deficiency, Fractures)  5. Laboratory exam ordered as part of routine general medical examination - CBC - CBC with Differential/Platelet - Comprehensive metabolic panel

## 2021-06-25 NOTE — Addendum Note (Signed)
Addended by: Rushie Goltz on: 06/25/2021 03:49 PM   Modules accepted: Orders

## 2021-06-26 LAB — CBC WITH DIFFERENTIAL/PLATELET
Absolute Monocytes: 549 cells/uL (ref 200–950)
Basophils Absolute: 31 cells/uL (ref 0–200)
Basophils Relative: 0.5 %
Eosinophils Absolute: 92 cells/uL (ref 15–500)
Eosinophils Relative: 1.5 %
HCT: 40.6 % (ref 35.0–45.0)
Hemoglobin: 13.6 g/dL (ref 11.7–15.5)
Lymphs Abs: 1586 cells/uL (ref 850–3900)
MCH: 31 pg (ref 27.0–33.0)
MCHC: 33.5 g/dL (ref 32.0–36.0)
MCV: 92.5 fL (ref 80.0–100.0)
MPV: 10.2 fL (ref 7.5–12.5)
Monocytes Relative: 9 %
Neutro Abs: 3843 cells/uL (ref 1500–7800)
Neutrophils Relative %: 63 %
Platelets: 329 10*3/uL (ref 140–400)
RBC: 4.39 10*6/uL (ref 3.80–5.10)
RDW: 11.8 % (ref 11.0–15.0)
Total Lymphocyte: 26 %
WBC: 6.1 10*3/uL (ref 3.8–10.8)

## 2021-06-26 LAB — COMPREHENSIVE METABOLIC PANEL
AG Ratio: 1.4 (calc) (ref 1.0–2.5)
ALT: 10 U/L (ref 6–29)
AST: 14 U/L (ref 10–35)
Albumin: 4 g/dL (ref 3.6–5.1)
Alkaline phosphatase (APISO): 76 U/L (ref 31–125)
BUN: 11 mg/dL (ref 7–25)
CO2: 21 mmol/L (ref 20–32)
Calcium: 9.2 mg/dL (ref 8.6–10.2)
Chloride: 108 mmol/L (ref 98–110)
Creat: 1.08 mg/dL (ref 0.50–1.10)
Globulin: 2.9 g/dL (calc) (ref 1.9–3.7)
Glucose, Bld: 78 mg/dL (ref 65–99)
Potassium: 4 mmol/L (ref 3.5–5.3)
Sodium: 138 mmol/L (ref 135–146)
Total Bilirubin: 0.8 mg/dL (ref 0.2–1.2)
Total Protein: 6.9 g/dL (ref 6.1–8.1)

## 2021-06-26 LAB — VITAMIN D 25 HYDROXY (VIT D DEFICIENCY, FRACTURES): Vit D, 25-Hydroxy: 29 ng/mL — ABNORMAL LOW (ref 30–100)

## 2021-06-29 ENCOUNTER — Other Ambulatory Visit: Payer: Self-pay | Admitting: Obstetrics and Gynecology

## 2021-06-29 DIAGNOSIS — Z1231 Encounter for screening mammogram for malignant neoplasm of breast: Secondary | ICD-10-CM

## 2021-07-01 LAB — CYTOLOGY - PAP
Comment: NEGATIVE
Diagnosis: NEGATIVE
Diagnosis: REACTIVE
High risk HPV: NEGATIVE

## 2021-07-30 ENCOUNTER — Other Ambulatory Visit: Payer: Self-pay | Admitting: Obstetrics and Gynecology

## 2021-07-30 DIAGNOSIS — N6489 Other specified disorders of breast: Secondary | ICD-10-CM

## 2021-08-04 ENCOUNTER — Other Ambulatory Visit: Payer: Self-pay

## 2021-08-04 ENCOUNTER — Ambulatory Visit
Admission: RE | Admit: 2021-08-04 | Discharge: 2021-08-04 | Disposition: A | Discharge status: Home / Self Care | Payer: BC Managed Care – PPO | Source: Ambulatory Visit | Attending: Obstetrics and Gynecology | Admitting: Obstetrics and Gynecology

## 2021-08-04 DIAGNOSIS — R922 Inconclusive mammogram: Secondary | ICD-10-CM | POA: Diagnosis not present

## 2021-08-04 DIAGNOSIS — N6489 Other specified disorders of breast: Secondary | ICD-10-CM

## 2021-10-19 ENCOUNTER — Other Ambulatory Visit: Payer: Self-pay | Admitting: Obstetrics and Gynecology

## 2021-10-19 DIAGNOSIS — Z3041 Encounter for surveillance of contraceptive pills: Secondary | ICD-10-CM

## 2022-01-19 ENCOUNTER — Other Ambulatory Visit: Payer: Self-pay | Admitting: Obstetrics and Gynecology

## 2022-01-19 DIAGNOSIS — Z3041 Encounter for surveillance of contraceptive pills: Secondary | ICD-10-CM

## 2022-01-19 NOTE — Telephone Encounter (Signed)
Last annual was on 06/25/21 per note "Will refill for 3 months, once mammogram is done will refill for the rest of the year (as long as it's normal)"  Last mammogram was normal 07/2021  Annual exam scheduled on 06/28/22

## 2022-06-23 NOTE — Progress Notes (Signed)
46 y.o. G0P0000 Single Black or African American Not Hispanic or Latino female here for annual exam.   Period Cycle (Days): 90 Period Duration (Days): 4 Period Pattern: Regular Menstrual Flow: Moderate Menstrual Control: Maxi pad Menstrual Control Change Freq (Hours): 2-3 Dysmenorrhea: (!) Moderate Dysmenorrhea Symptoms: Cramping, Headache, Diarrhea, Nausea  Patient's last menstrual period was 04/22/2022 (approximate).          Sexually active: Yes.    The current method of family planning is OCP.    Exercising: Yes.     Walking  Smoker:  no  Health Maintenance: Pap:  06/25/21 WNL HR HPV Neg  03/29/18 WNL neg Hr HPV, 03-25-17, ASCUS HPV NR neg History of abnormal Pap:   yes CIN I In 2018  MMG:  08/04/21 density B Birads 2 benign  BMD:   none  Colonoscopy: none  TDaP:  01/21/12  Gardasil: n/a   reports that she has never smoked. She has never used smokeless tobacco. She reports current alcohol use of about 2.0 - 3.0 standard drinks of alcohol per week. She reports that she does not use drugs. She works as an Airline pilot.  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     JOLESSA 0.15-0.03 MG tablet Take 1 tablet by mouth once daily 91 tablet 1   Multiple Vitamins-Minerals (MULTIVITAMIN PO) Take by mouth as needed.      Vitamin D, Ergocalciferol, (DRISDOL) 1.25 MG (50000 UNIT) CAPS capsule Take 1 capsule (50,000 Units total) by mouth every 7 (seven) days. 12 capsule 0   No current facility-administered medications for this visit.    Family History  Problem Relation Age of Onset   Cancer Paternal Grandfather    Uterine cancer Mother 5        had radiation    Review of Systems  Exam:   BP 112/76   Pulse 72   Ht 5' (1.524 m)   Wt 179 lb (81.2 kg)   LMP 04/22/2022 (Approximate)   SpO2 100%   BMI 34.96 kg/m   Weight change: @WEIGHTCHANGE @ Height:   Height: 5' (152.4 cm)  Ht Readings from Last 3 Encounters:  06/28/22 5' (1.524 m)  06/25/21 5' (1.524 m)  06/19/20 5' (1.524 m)    General appearance: alert, cooperative and appears stated age Head: Normocephalic, without obvious abnormality, atraumatic Neck: no adenopathy, supple, symmetrical, trachea midline and thyroid normal to inspection and palpation Lungs: clear to auscultation bilaterally Cardiovascular: regular rate and rhythm Breasts: normal appearance, no masses or tenderness Abdomen: soft, non-tender; non distended,  no masses,  no organomegaly Extremities: extremities normal, atraumatic, no cyanosis or edema Skin: Skin color, texture, turgor normal. No rashes or lesions Lymph nodes: Cervical, supraclavicular, and axillary nodes normal. No abnormal inguinal nodes palpated Neurologic: Grossly normal   Pelvic: External genitalia:  no lesions              Urethra:  normal appearing urethra with no masses, tenderness or lesions              Bartholins and Skenes: normal  Vagina: normal appearing vagina with normal color and discharge, no lesions              Cervix: no lesions               Bimanual Exam:  Uterus:  normal size, contour, position, consistency, mobility, non-tender              Adnexa: no mass, fullness, tenderness               Rectovaginal: Confirms               Anus:  normal sphincter tone, no lesions  Carolynn Serve, CMA chaperoned for the exam.  1. Well woman exam Discussed breast self exam Discussed calcium and vit D intake No screening lab this year  2. Encounter for surveillance of contraceptive pills - levonorgestrel-ethinyl estradiol (JOLESSA) 0.15-0.03 MG tablet; Take 1 tablet by mouth daily.  Dispense: 91 tablet;  Refill: 3  3. Vitamin D deficiency She is taking vit d, declines blood work  4. Immunization due - Tdap vaccine greater than or equal to 7yo IM

## 2022-06-28 ENCOUNTER — Ambulatory Visit (INDEPENDENT_AMBULATORY_CARE_PROVIDER_SITE_OTHER): Payer: BC Managed Care – PPO | Admitting: Obstetrics and Gynecology

## 2022-06-28 ENCOUNTER — Other Ambulatory Visit: Payer: Self-pay | Admitting: Obstetrics and Gynecology

## 2022-06-28 ENCOUNTER — Encounter: Payer: Self-pay | Admitting: Obstetrics and Gynecology

## 2022-06-28 VITALS — BP 112/76 | HR 72 | Ht 60.0 in | Wt 179.0 lb

## 2022-06-28 DIAGNOSIS — Z01419 Encounter for gynecological examination (general) (routine) without abnormal findings: Secondary | ICD-10-CM

## 2022-06-28 DIAGNOSIS — E559 Vitamin D deficiency, unspecified: Secondary | ICD-10-CM | POA: Diagnosis not present

## 2022-06-28 DIAGNOSIS — Z23 Encounter for immunization: Secondary | ICD-10-CM | POA: Diagnosis not present

## 2022-06-28 DIAGNOSIS — Z3041 Encounter for surveillance of contraceptive pills: Secondary | ICD-10-CM | POA: Diagnosis not present

## 2022-06-28 DIAGNOSIS — Z1231 Encounter for screening mammogram for malignant neoplasm of breast: Secondary | ICD-10-CM

## 2022-06-28 MED ORDER — LEVONORGEST-ETH ESTRAD 91-DAY 0.15-0.03 MG PO TABS: 1.0000 | ORAL_TABLET | Freq: Every day | ORAL | 3 refills | Status: DC

## 2022-06-28 NOTE — Patient Instructions (Signed)

## 2022-08-05 ENCOUNTER — Ambulatory Visit
Admission: RE | Admit: 2022-08-05 | Discharge: 2022-08-05 | Disposition: A | Discharge status: Home / Self Care | Payer: BC Managed Care – PPO | Source: Ambulatory Visit | Attending: Obstetrics and Gynecology | Admitting: Obstetrics and Gynecology

## 2022-08-05 DIAGNOSIS — Z1231 Encounter for screening mammogram for malignant neoplasm of breast: Secondary | ICD-10-CM | POA: Diagnosis not present

## 2022-12-31 IMAGING — MG DIGITAL DIAGNOSTIC BILAT W/ TOMO W/ CAD
8 series · 8 of 24 positions shown · non-contrast
Comparison: Previous exam(s).

CLINICAL DATA: Short-term follow-up for a probably benign left
breast asymmetry. This was initially a recall from a baseline
screening study dated 09/15/2018, evaluated with diagnostic imaging
on 09/22/2018 and again on 06/20/2019. This is her first follow-up
study since the 06/20/2019 exam.

EXAM:
DIGITAL DIAGNOSTIC BILATERAL MAMMOGRAM WITH TOMOSYNTHESIS AND CAD
TECHNIQUE: Bilateral digital diagnostic mammography and breast tomosynthesis
was performed. The images were evaluated with computer-aided
detection.

[R MLO synth-2D]
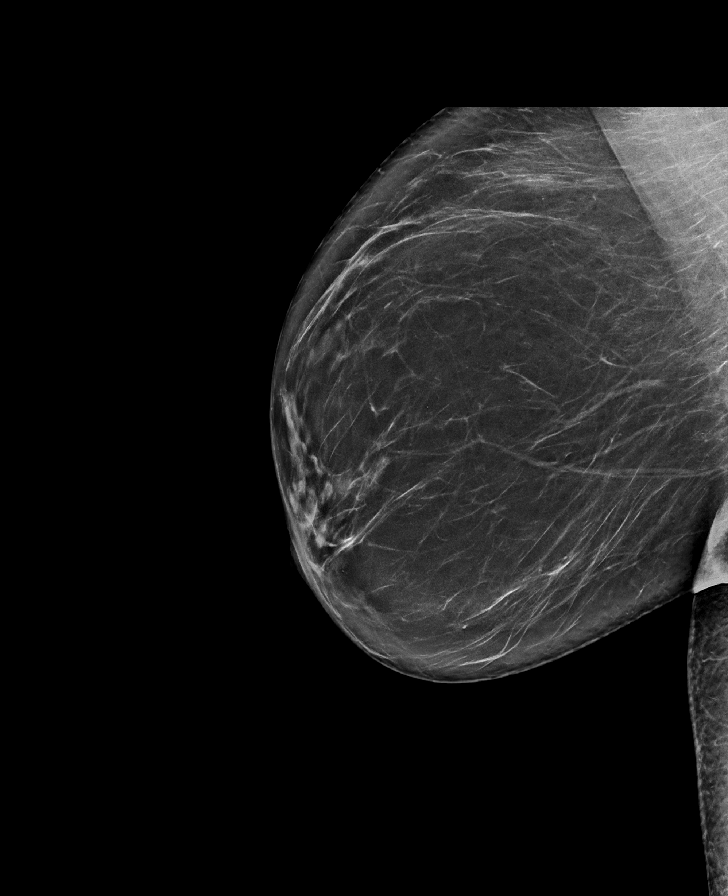

[L CC synth-2D]
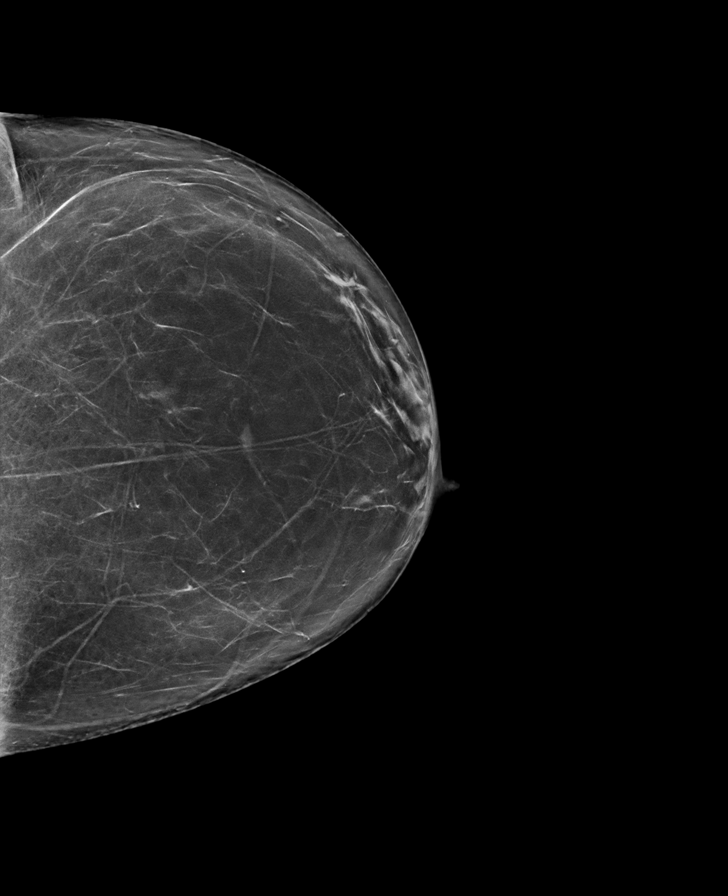

[L MLO synth-2D]
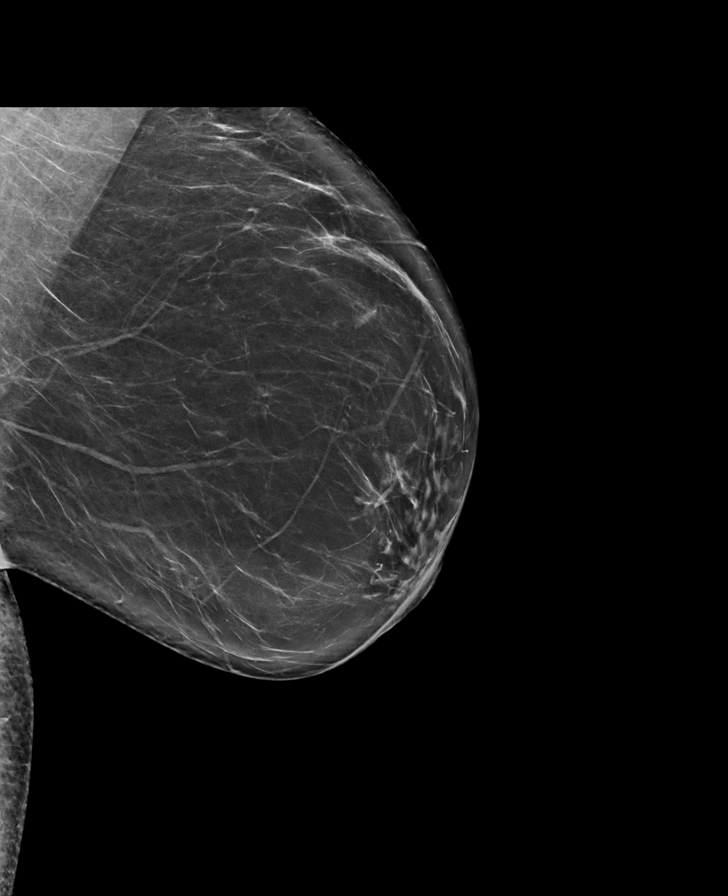

[R CC synth-2D]
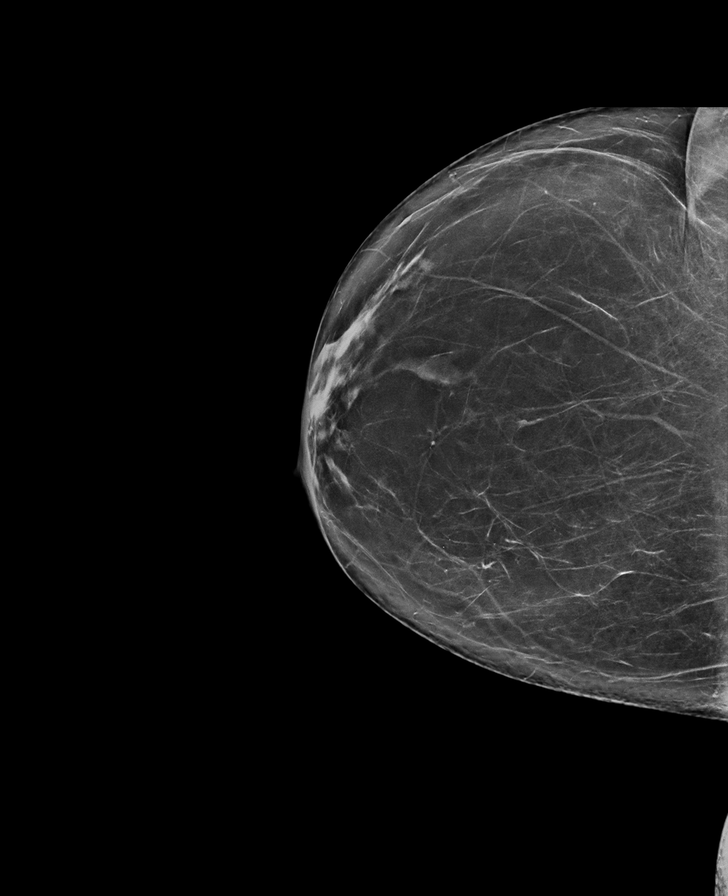

[R MLO tomo · tomo slice 43/84.0]
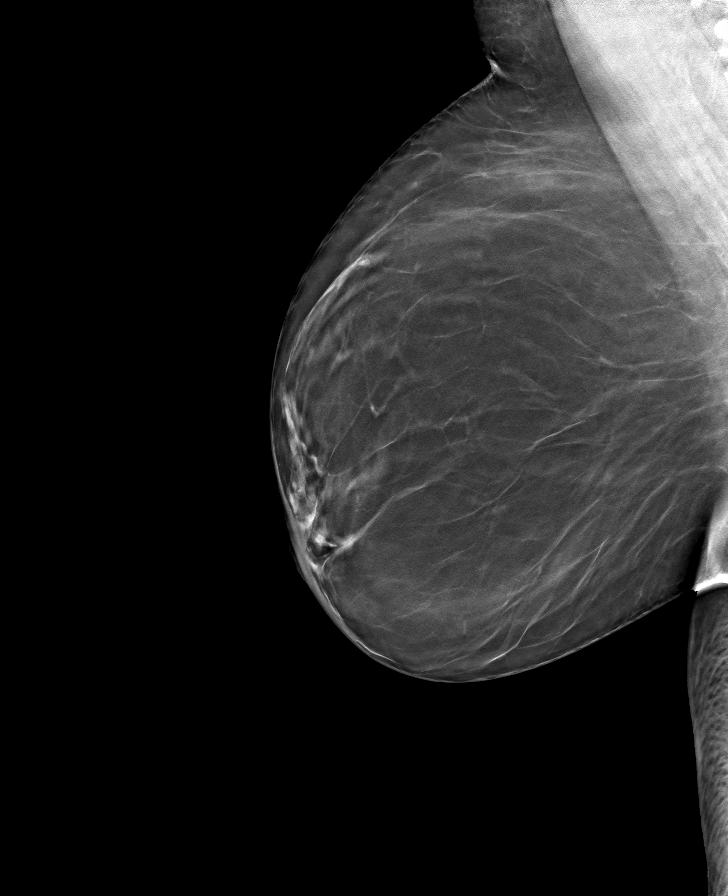

[L MLO tomo · tomo slice 43/86.0]
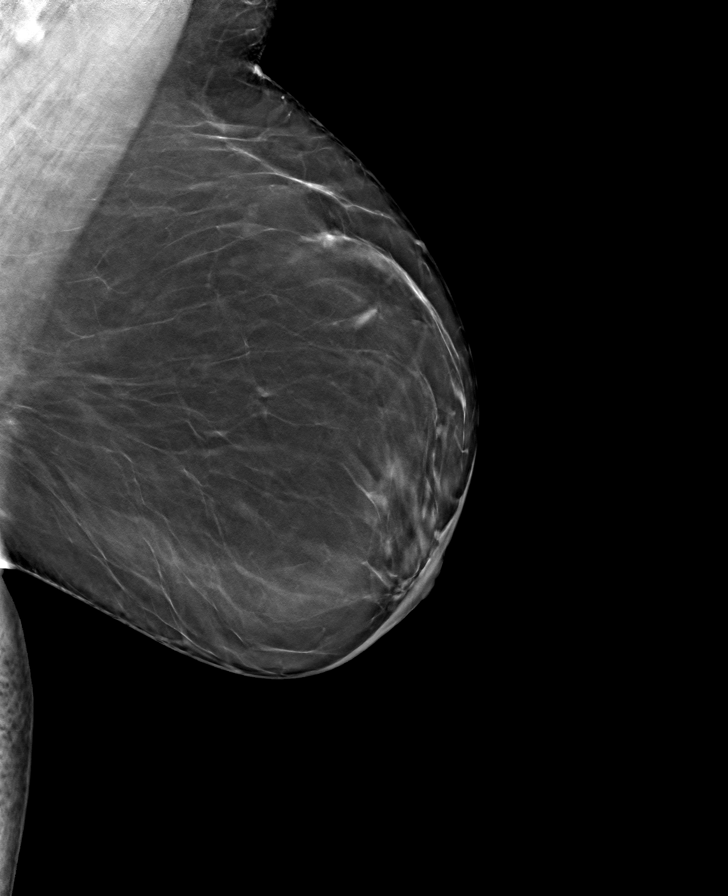

[L CC tomo · tomo slice 39/78.0]
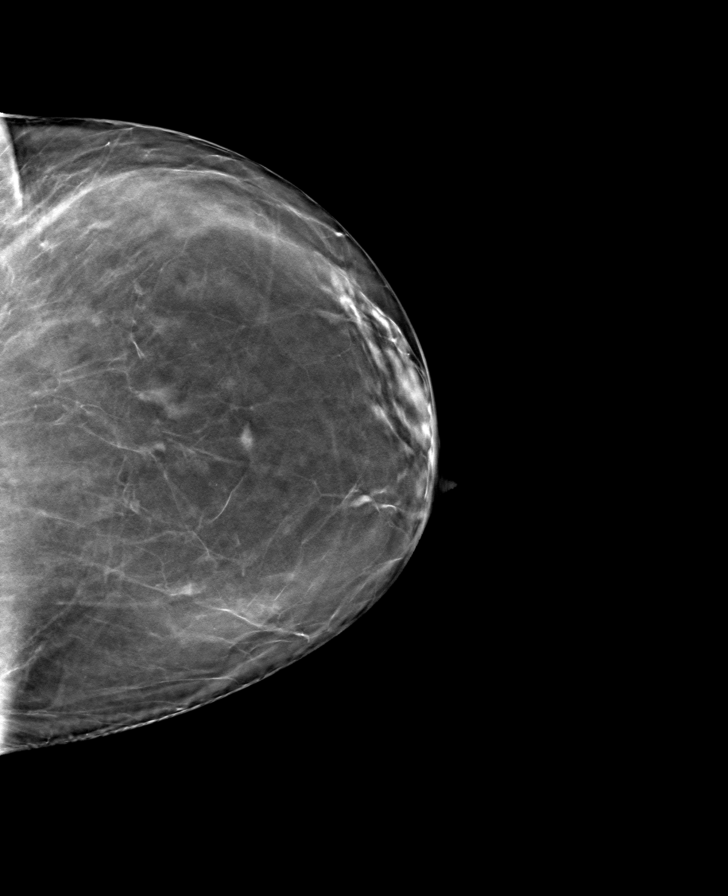

[R CC tomo · tomo slice 41/80.0]
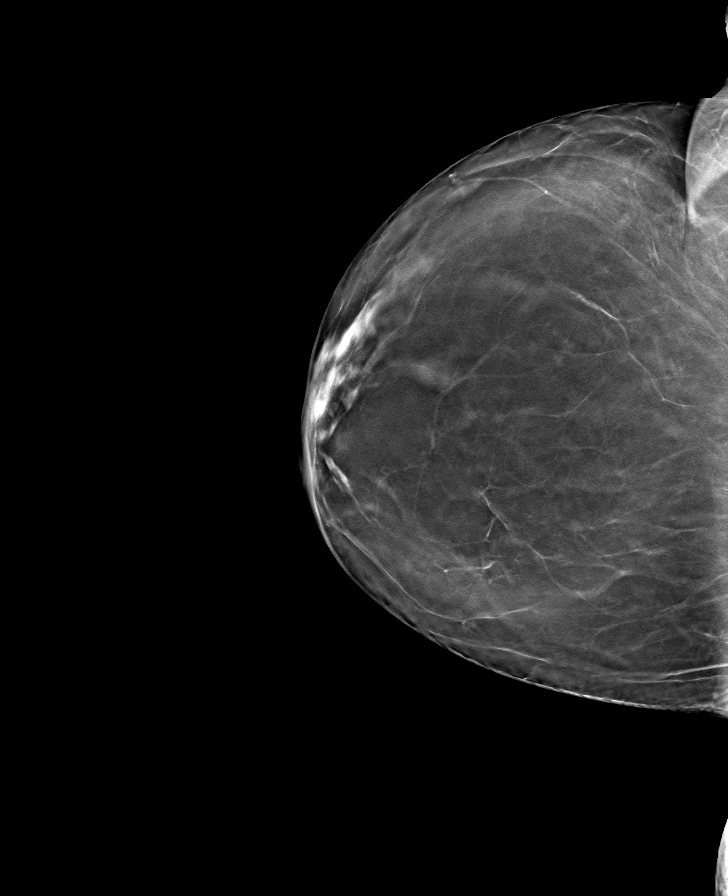

[8 of 24 positions shown; findings below may reference images not displayed]

ACR Breast Density Category b: There are scattered areas of
fibroglandular density.
FINDINGS: The area of asymmetry in the left breast is unchanged. There are no
masses, new areas of asymmetry, areas of architectural distortion or
suspicious calcifications. No mammographic change.
IMPRESSION: 1. No evidence of breast malignancy.
2. Benign/stable area of asymmetry in the left breast consistent
with an island of fibroglandular tissue.

RECOMMENDATION:
Screening mammogram in one year.(Code:TL-[id number])

I have discussed the findings and recommendations with the patient.
If applicable, a reminder letter will be sent to the patient
regarding the next appointment.

BI-RADS CATEGORY  2: Benign.

## 2023-07-04 ENCOUNTER — Ambulatory Visit: Payer: BC Managed Care – PPO | Admitting: Obstetrics and Gynecology

## 2023-07-05 ENCOUNTER — Encounter: Payer: Self-pay | Admitting: Radiology

## 2023-07-05 ENCOUNTER — Ambulatory Visit (INDEPENDENT_AMBULATORY_CARE_PROVIDER_SITE_OTHER): Payer: BC Managed Care – PPO | Admitting: Radiology

## 2023-07-05 VITALS — BP 132/88 | Ht 60.0 in | Wt 181.0 lb

## 2023-07-05 DIAGNOSIS — Z01419 Encounter for gynecological examination (general) (routine) without abnormal findings: Secondary | ICD-10-CM | POA: Diagnosis not present

## 2023-07-05 DIAGNOSIS — Z3041 Encounter for surveillance of contraceptive pills: Secondary | ICD-10-CM

## 2023-07-05 DIAGNOSIS — Z1211 Encounter for screening for malignant neoplasm of colon: Secondary | ICD-10-CM

## 2023-07-05 MED ORDER — LEVONORGEST-ETH ESTRAD 91-DAY 0.15-0.03 MG PO TABS
1.0000 | ORAL_TABLET | Freq: Every day | ORAL | 4 refills | Status: DC
Start: 2023-07-05 — End: 2024-07-06

## 2023-07-05 NOTE — Progress Notes (Signed)
   Shams Fill May 05, 1976 [id number]   History:  47 y.o. G0 presents for annual exam. No gyn concerns, Happy with OCPs.  Gynecologic History Patient's last menstrual period was 04/20/2023 (approximate). Period Pattern: Regular (continuous ocps) Menstrual Control: Maxi pad, Thin pad Dysmenorrhea: (!) Moderate Dysmenorrhea Symptoms: Cramping Contraception/Family planning: OCP (estrogen/progesterone) Sexually active: yes Last Pap: 2022. Results were: normal Last mammogram: 07/2022. Results were: normal  Obstetric History OB History  Gravida Para Term Preterm AB Living  0 0 0 0 0 0  SAB IAB Ectopic Multiple Live Births  0 0 0 0       The following portions of the patient's history were reviewed and updated as appropriate: allergies, current medications, past family history, past medical history, past social history, past surgical history, and problem list.  Review of Systems Pertinent items noted in HPI and remainder of comprehensive ROS otherwise negative.   Past medical history, past surgical history, family history and social history were all reviewed and documented in the EPIC chart.   Exam:  Vitals:   07/05/23 1431  BP: 132/88  Weight: 181 lb (82.1 kg)  Height: 5' (1.524 m)   Body mass index is 35.35 kg/m.  General appearance:  Normal Thyroid:  Symmetrical, normal in size, without palpable masses or nodularity. Respiratory  Auscultation:  Clear without wheezing or rhonchi Cardiovascular  Auscultation:  Regular rate, without rubs, murmurs or gallops  Edema/varicosities:  Not grossly evident Abdominal  Soft,nontender, without masses, guarding or rebound.  Liver/spleen:  No organomegaly noted  Hernia:  None appreciated  Skin  Inspection:  Grossly normal Breasts: Examined lying and sitting.   Right: Without masses, retractions, nipple discharge or axillary adenopathy.   Left: Without masses, retractions, nipple discharge or axillary  adenopathy. Genitourinary   Inguinal/mons:  Normal without inguinal adenopathy  External genitalia:  Normal appearing vulva with no masses, tenderness, or lesions  BUS/Urethra/Skene's glands:  Normal without masses or exudate  Vagina:  Normal appearing with normal color and discharge, no lesions  Cervix:  Normal appearing without discharge or lesions  Uterus:  Normal in size, shape and contour.  Mobile, nontender  Adnexa/parametria:     Rt: Normal in size, without masses or tenderness.   Lt: Normal in size, without masses or tenderness.  Anus and perineum: Normal   Raynelle Fanning, CMA present for exam  Assessment/Plan:   1. Well woman exam with routine gynecological exam Pap 2025  2. Colon cancer screening - Cologuard  3. Encounter for surveillance of contraceptive pills - levonorgestrel-ethinyl estradiol (JOLESSA) 0.15-0.03 MG tablet; Take 1 tablet by mouth daily.  Dispense: 91 tablet; Refill: 4     Discussed SBE, colonoscopy and DEXA screening as directed/appropriate. Recommend of exercise weekly, including weight bearing exercise. Encouraged the use of seatbelts and sunscreen. Return in 1 year for annual or as needed.   Arlie Solomons B WHNP-BC 2:46 PM 07/05/2023

## 2023-07-13 DIAGNOSIS — Z1211 Encounter for screening for malignant neoplasm of colon: Secondary | ICD-10-CM | POA: Diagnosis not present

## 2023-07-20 ENCOUNTER — Other Ambulatory Visit: Payer: Self-pay | Admitting: Radiology

## 2023-07-20 DIAGNOSIS — Z1231 Encounter for screening mammogram for malignant neoplasm of breast: Secondary | ICD-10-CM

## 2023-08-09 ENCOUNTER — Ambulatory Visit
Admission: RE | Admit: 2023-08-09 | Discharge: 2023-08-09 | Disposition: A | Discharge status: Home / Self Care | Payer: BC Managed Care – PPO | Source: Ambulatory Visit | Attending: Radiology | Admitting: Radiology

## 2023-08-09 DIAGNOSIS — Z1231 Encounter for screening mammogram for malignant neoplasm of breast: Secondary | ICD-10-CM

## 2024-07-05 ENCOUNTER — Other Ambulatory Visit: Payer: Self-pay | Admitting: Radiology

## 2024-07-05 DIAGNOSIS — Z3041 Encounter for surveillance of contraceptive pills: Secondary | ICD-10-CM

## 2024-07-05 NOTE — Telephone Encounter (Signed)
 Med refill request: Jolessa Last AEX: 07/05/23 Next AEX: 07/06/24 Last MMG (if hormonal med) 08/09/23 BI-RADS 1 negative Refill authorized: Please Advise?

## 2024-07-06 ENCOUNTER — Encounter: Payer: Self-pay | Admitting: Radiology

## 2024-07-06 ENCOUNTER — Ambulatory Visit (INDEPENDENT_AMBULATORY_CARE_PROVIDER_SITE_OTHER): Payer: BC Managed Care – PPO | Admitting: Radiology

## 2024-07-06 VITALS — BP 128/60 | HR 79 | Ht 60.0 in | Wt 189.4 lb

## 2024-07-06 DIAGNOSIS — Z3041 Encounter for surveillance of contraceptive pills: Secondary | ICD-10-CM

## 2024-07-06 DIAGNOSIS — Z01419 Encounter for gynecological examination (general) (routine) without abnormal findings: Secondary | ICD-10-CM

## 2024-07-06 DIAGNOSIS — Z1331 Encounter for screening for depression: Secondary | ICD-10-CM | POA: Diagnosis not present

## 2024-07-06 MED ORDER — LEVONORGEST-ETH ESTRAD 91-DAY 0.15-0.03 MG PO TABS
1.0000 | ORAL_TABLET | Freq: Every day | ORAL | 4 refills | Status: AC
Start: 2024-07-06 — End: ?

## 2024-07-06 NOTE — Patient Instructions (Signed)
 Preventive Care 16-50 Years Old, Female  Preventive care refers to lifestyle choices and visits with your health care provider that can promote health and wellness. Preventive care visits are also called wellness exams.  What can I expect for my preventive care visit?  Counseling  Your health care provider may ask you questions about your:  Medical history, including:  Past medical problems.  Family medical history.  Pregnancy history.  Current health, including:  Menstrual cycle.  Method of birth control.  Emotional well-being.  Home life and relationship well-being.  Sexual activity and sexual health.  Lifestyle, including:  Alcohol, nicotine or tobacco, and drug use.  Access to firearms.  Diet, exercise, and sleep habits.  Work and work Astronomer.  Sunscreen use.  Safety issues such as seatbelt and bike helmet use.  Physical exam  Your health care provider will check your:  Height and weight. These may be used to calculate your BMI (body mass index). BMI is a measurement that tells if you are at a healthy weight.  Waist circumference. This measures the distance around your waistline. This measurement also tells if you are at a healthy weight and may help predict your risk of certain diseases, such as type 2 diabetes and high blood pressure.  Heart rate and blood pressure.  Body temperature.  Skin for abnormal spots.  What immunizations do I need?    Vaccines are usually given at various ages, according to a schedule. Your health care provider will recommend vaccines for you based on your age, medical history, and lifestyle or other factors, such as travel or where you work.  What tests do I need?  Screening  Your health care provider may recommend screening tests for certain conditions. This may include:  Lipid and cholesterol levels.  Diabetes screening. This is done by checking your blood sugar (glucose) after you have not eaten for a while (fasting).  Pelvic exam and Pap test.  Hepatitis B test.  Hepatitis C  test.  HIV (human immunodeficiency virus) test.  STI (sexually transmitted infection) testing, if you are at risk.  Lung cancer screening.  Colorectal cancer screening.  Mammogram. Talk with your health care provider about when you should start having regular mammograms. This may depend on whether you have a family history of breast cancer.  BRCA-related cancer screening. This may be done if you have a family history of breast, ovarian, tubal, or peritoneal cancers.  Bone density scan. This is done to screen for osteoporosis.  Talk with your health care provider about your test results, treatment options, and if necessary, the need for more tests.  Follow these instructions at home:  Eating and drinking    Eat a diet that includes fresh fruits and vegetables, whole grains, lean protein, and low-fat dairy products.  Take vitamin and mineral supplements as recommended by your health care provider.  Do not drink alcohol if:  Your health care provider tells you not to drink.  You are pregnant, may be pregnant, or are planning to become pregnant.  If you drink alcohol:  Limit how much you have to 0-1 drink a day.  Know how much alcohol is in your drink. In the [place] one drink equals one 12 oz bottle of beer (355 mL), one 5 oz glass of wine (148 mL), or one 1 oz glass of hard liquor (44 mL).  Lifestyle  Brush your teeth every morning and night with fluoride toothpaste. Floss one time each day.  Exercise for at least  30 minutes 5 or more days each week.  Do not use any products that contain nicotine or tobacco. These products include cigarettes, chewing tobacco, and vaping devices, such as e-cigarettes. If you need help quitting, ask your health care provider.  Do not use drugs.  If you are sexually active, practice safe sex. Use a condom or other form of protection to prevent STIs.  If you do not wish to become pregnant, use a form of birth control. If you plan to become pregnant, see your health care provider for a  prepregnancy visit.  Take aspirin only as told by your health care provider. Make sure that you understand how much to take and what form to take. Work with your health care provider to find out whether it is safe and beneficial for you to take aspirin daily.  Find healthy ways to manage stress, such as:  Meditation, yoga, or listening to music.  Journaling.  Talking to a trusted person.  Spending time with friends and family.  Minimize exposure to UV radiation to reduce your risk of skin cancer.  Safety  Always wear your seat belt while driving or riding in a vehicle.  Do not drive:  If you have been drinking alcohol. Do not ride with someone who has been drinking.  When you are tired or distracted.  While texting.  If you have been using any mind-altering substances or drugs.  Wear a helmet and other protective equipment during sports activities.  If you have firearms in your house, make sure you follow all gun safety procedures.  Seek help if you have been physically or sexually abused.  What's next?  Visit your health care provider once a year for an annual wellness visit.  Ask your health care provider how often you should have your eyes and teeth checked.  Stay up to date on all vaccines.  This information is not intended to replace advice given to you by your health care provider. Make sure you discuss any questions you have with your health care provider.  Document Revised: 06/03/2021 Document Reviewed: 06/03/2021  Elsevier Patient Education  2024 [employer].

## 2024-07-06 NOTE — Progress Notes (Signed)
 Lauren Newman 1976-10-29 [id number]   History:  48 y.o. G0 presents for annual exam. No gyn concerns. Happy with OCPs.  Gynecologic History Patient's last menstrual period was 04/19/2024 (within days). Period Duration (Days): 5 Period Pattern: (!) Irregular Menstrual Flow: Light, Moderate, Heavy Menstrual Control: Maxi pad Dysmenorrhea: (!) Mild Dysmenorrhea Symptoms: Cramping, Headache, Diarrhea, Nausea Contraception/Family planning: OCP (estrogen/progesterone) Sexually active: yes Last Pap: 7/22. Results were: normal, HPV negative Last mammogram: 07/2023. Results were: normal  Obstetric History OB History  Gravida Para Term Preterm AB Living  0 0 0 0 0 0  SAB IAB Ectopic Multiple Live Births  0 0 0 0        07/06/2024    2:37 PM  Depression screen PHQ 2/9  Decreased Interest 0  Down, Depressed, Hopeless 0  PHQ - 2 Score 0     The following portions of the patient's history were reviewed and updated as appropriate: allergies, current medications, past family history, past medical history, past social history, past surgical history, and problem list.  Review of Systems  All other systems reviewed and are negative.   Past medical history, past surgical history, family history and social history were all reviewed and documented in the EPIC chart.  Exam:  Vitals:   07/06/24 1432  BP: 128/60  Pulse: 79  SpO2: 98%  Weight: 189 lb 6.4 oz (85.9 kg)  Height: 5' (1.524 m)   Body mass index is 36.99 kg/m.  Physical Exam Vitals and nursing note reviewed. Exam conducted with a chaperone present.  Constitutional:      Appearance: Normal appearance. She is normal weight.  HENT:     Head: Normocephalic and atraumatic.  Neck:     Thyroid: No thyroid mass, thyromegaly or thyroid tenderness.  Cardiovascular:     Rate and Rhythm: Regular rhythm.     Heart sounds: Normal heart sounds.  Pulmonary:     Effort: Pulmonary effort is normal.     Breath sounds:  Normal breath sounds.  Chest:  Breasts:    Breasts are symmetrical.     Right: Normal. No inverted nipple, mass, nipple discharge, skin change or tenderness.     Left: Normal. No inverted nipple, mass, nipple discharge, skin change or tenderness.  Abdominal:     General: Abdomen is flat. Bowel sounds are normal.     Palpations: Abdomen is soft.  Genitourinary:    General: Normal vulva.     Vagina: Normal. No vaginal discharge, bleeding or lesions.     Cervix: Normal. No discharge or lesion.     Uterus: Normal. Not enlarged and not tender.      Adnexa: Right adnexa normal and left adnexa normal.       Right: No mass, tenderness or fullness.         Left: No mass, tenderness or fullness.    Lymphadenopathy:     Upper Body:     Right upper body: No axillary adenopathy.     Left upper body: No axillary adenopathy.  Skin:    General: Skin is warm and dry.  Neurological:     Mental Status: She is alert and oriented to person, place, and time.  Psychiatric:        Mood and Affect: Mood normal.        Thought Content: Thought content normal.        Judgment: Judgment normal.      Darice Hoit, CMA present for exam  Assessment/Plan:   1.  Well woman exam with routine gynecological exam (Primary) Pap with cotesting 2027  2. Encounter for surveillance of contraceptive pills - levonorgestrel-ethinyl estradiol (JOLESSA) 0.15-0.03 MG tablet; Take 1 tablet by mouth daily.  Dispense: 91 tablet; Refill: 4  3. Depression screening negative    Discussed SBE, colonoscopy and DEXA screening as directed/appropriate. Recommend of exercise weekly, including weight bearing exercise. Encouraged the use of seatbelts and sunscreen.  No follow-ups on file.  GINETTE COZIER B WHNP-BC 2:47 PM 07/06/2024

## 2024-09-03 ENCOUNTER — Other Ambulatory Visit: Payer: Self-pay | Admitting: Radiology

## 2024-09-03 DIAGNOSIS — Z1231 Encounter for screening mammogram for malignant neoplasm of breast: Secondary | ICD-10-CM

## 2024-09-18 ENCOUNTER — Ambulatory Visit
Admission: RE | Admit: 2024-09-18 | Discharge: 2024-09-18 | Disposition: A | Discharge status: Home / Self Care | Source: Ambulatory Visit | Attending: Radiology | Admitting: Radiology

## 2024-09-18 DIAGNOSIS — Z1231 Encounter for screening mammogram for malignant neoplasm of breast: Secondary | ICD-10-CM

## 2025-07-10 ENCOUNTER — Ambulatory Visit: Admitting: Radiology

## 2025-07-16 ENCOUNTER — Ambulatory Visit: Admitting: Radiology
